# Patient Record
Sex: Female | Born: 1975 | Race: Black or African American | Hispanic: No | Smoking: Never smoker
Health system: Southern US, Community
[De-identification: ages and names within clinical notes are randomized; demographics above are authoritative.]

## PROBLEM LIST (undated history)

## (undated) DIAGNOSIS — I10 Essential (primary) hypertension: Secondary | ICD-10-CM

## (undated) DIAGNOSIS — G43909 Migraine, unspecified, not intractable, without status migrainosus: Secondary | ICD-10-CM

## (undated) HISTORY — PX: TUBAL LIGATION: SHX77

## (undated) HISTORY — DX: Essential (primary) hypertension: I10

---

## 2003-05-08 ENCOUNTER — Inpatient Hospital Stay (HOSPITAL_COMMUNITY): Admission: AD | Admit: 2003-05-08 | Discharge: 2003-05-11 | Payer: Self-pay | Admitting: Obstetrics and Gynecology

## 2005-12-29 ENCOUNTER — Emergency Department (HOSPITAL_COMMUNITY): Admission: EM | Admit: 2005-12-29 | Discharge: 2005-12-29 | Payer: Self-pay | Admitting: Emergency Medicine

## 2006-06-22 ENCOUNTER — Ambulatory Visit (HOSPITAL_COMMUNITY): Admission: AD | Admit: 2006-06-22 | Discharge: 2006-06-22 | Payer: Self-pay | Admitting: Obstetrics and Gynecology

## 2006-06-25 ENCOUNTER — Ambulatory Visit (HOSPITAL_COMMUNITY): Admission: AD | Admit: 2006-06-25 | Discharge: 2006-06-25 | Payer: Self-pay | Admitting: Obstetrics and Gynecology

## 2006-06-29 ENCOUNTER — Ambulatory Visit (HOSPITAL_COMMUNITY): Admission: AD | Admit: 2006-06-29 | Discharge: 2006-06-29 | Payer: Self-pay | Admitting: Obstetrics and Gynecology

## 2006-07-02 ENCOUNTER — Ambulatory Visit (HOSPITAL_COMMUNITY): Admission: AD | Admit: 2006-07-02 | Discharge: 2006-07-02 | Payer: Self-pay | Admitting: Obstetrics and Gynecology

## 2006-07-06 ENCOUNTER — Ambulatory Visit (HOSPITAL_COMMUNITY): Admission: AD | Admit: 2006-07-06 | Discharge: 2006-07-06 | Payer: Self-pay | Admitting: Obstetrics and Gynecology

## 2006-07-09 ENCOUNTER — Ambulatory Visit (HOSPITAL_COMMUNITY): Admission: AD | Admit: 2006-07-09 | Discharge: 2006-07-09 | Payer: Self-pay | Admitting: Obstetrics and Gynecology

## 2006-07-13 ENCOUNTER — Ambulatory Visit (HOSPITAL_COMMUNITY): Admission: AD | Admit: 2006-07-13 | Discharge: 2006-07-13 | Payer: Self-pay | Admitting: Obstetrics and Gynecology

## 2006-07-16 ENCOUNTER — Ambulatory Visit (HOSPITAL_COMMUNITY): Admission: AD | Admit: 2006-07-16 | Discharge: 2006-07-16 | Payer: Self-pay | Admitting: Obstetrics and Gynecology

## 2006-07-20 ENCOUNTER — Ambulatory Visit (HOSPITAL_COMMUNITY): Admission: AD | Admit: 2006-07-20 | Discharge: 2006-07-20 | Payer: Self-pay | Admitting: Obstetrics and Gynecology

## 2006-07-23 ENCOUNTER — Ambulatory Visit (HOSPITAL_COMMUNITY): Admission: AD | Admit: 2006-07-23 | Discharge: 2006-07-23 | Payer: Self-pay | Admitting: Obstetrics and Gynecology

## 2006-07-27 ENCOUNTER — Ambulatory Visit (HOSPITAL_COMMUNITY): Admission: AD | Admit: 2006-07-27 | Discharge: 2006-07-27 | Payer: Self-pay | Admitting: Obstetrics and Gynecology

## 2006-07-29 ENCOUNTER — Inpatient Hospital Stay (HOSPITAL_COMMUNITY): Admission: AD | Admit: 2006-07-29 | Discharge: 2006-08-01 | Payer: Self-pay | Admitting: Obstetrics and Gynecology

## 2006-07-29 ENCOUNTER — Encounter (INDEPENDENT_AMBULATORY_CARE_PROVIDER_SITE_OTHER): Payer: Self-pay | Admitting: Specialist

## 2008-02-12 ENCOUNTER — Inpatient Hospital Stay (HOSPITAL_COMMUNITY): Admission: RE | Admit: 2008-02-12 | Discharge: 2008-02-15 | Payer: Self-pay | Admitting: Obstetrics & Gynecology

## 2008-02-12 ENCOUNTER — Encounter: Payer: Self-pay | Admitting: Obstetrics & Gynecology

## 2010-09-24 NOTE — Op Note (Signed)
April Finley, April Finley                 ACCOUNT NO.:  [id number]   MEDICAL RECORD NO.:  [id number]          PATIENT TYPE:  INP   LOCATION:  9115                          FACILITY:  [facility]   PHYSICIAN:  Lazaro Arms, M.D.   DATE OF BIRTH:  Jul 31, 1975   DATE OF PROCEDURE:  02/12/2008  DATE OF DISCHARGE:                               OPERATIVE REPORT   PREOPERATIVE DIAGNOSES:  1. Intrauterine pregnancy at 7 weeks' gestation.  2. Previous cesarean section x2.  3. Chronic hypertension.  4. Desires sterilization.   POSTOPERATIVE DIAGNOSES:  1. Intrauterine pregnancy at 70 weeks' gestation.  2. Previous cesarean section x2.  3. Chronic hypertension.  4. Desires sterilization.  5. Back-up transverse lie.   PROCEDURES:  1. Repeat cesarean section.  2. Tubal ligation.   SURGEON:  Lazaro Arms, MD   ANESTHESIA:  Spinal.   FINDINGS:  Low-transverse hysterotomy incision was made, entered the  uterus, and found that the baby was a back-up transverse lie, really  could not get the head down.  So, I did a breach extraction because of  her previous C-sections x3, she really had no abdominal wall give at all  and so, I had to cut the muscles and actually extend the incision a  little bit in order to deliver the head.  There was a nuchal cord x1,  three-vessel cord.  Cord blood and cord gasses were sent.  Dr. Franchot Mimes was  in attendance for routine neonatal resuscitation.   DESCRIPTION OF OPERATION:  The patient was taken to the operating room  and placed in sitting position, underwent a spinal anesthetic.  She was  then placed in supine position.  She was prepped and draped in the usual  sterile fashion.  A Foley catheter was placed.  Pfannenstiel skin  incision was made carrying down sharp through rectus fascia, scored in  the midline extending laterally.  The fascia was taken off the muscles  superiorly and inferiorly without difficulty.  Muscles were divided.  Peritoneal cavity was entered.   There was a very low anterior abdominal  wall mobility.  Really, no bladder flap could be developed.  So, I made  an incision in the uterus transversely and entered the uterine cavity.  There was amniotic clear fluid.  The baby was in a back-up transverse  lie.  Really, could not get the head down, so did a breach extraction.  Had to cut the anterior abdominal wall muscles and extend the incision  in order to deliver the head. There was a loose nuchal  x1.  Apgars were  6 and 9, it was a female.  Cord gas was sent to have pH at the time of  dictation.  The placenta was delivered spontaneously.  The uterus was  closed in 2 layers, the first being a running interlocking layer, the  second being an imbricating layer.  I then did the modified Pomeroy  tubal ligation.  There was bleeding on the right tube, so I had to  reinforce sutures and sewed up the mesosalpinx on the right.  I had  to  put some interrupted sutures in the uterus for hemostasis.  The pelvis  was irrigated vigorously.  There was good hemostasis of all pedicles at  this point.  Muscles had to be reapproximated with chromic, because I  had to transect them in order to get the baby's head out.  The fascia  was closed using 0 Vicryl running.  The skin was closed using skin  staples.  The patient tolerated the procedure well.  She experienced  1000 mL of blood loss and taken to the recovery in good and stable  condition.  All counts correct x3.      Lazaro Arms, M.D.  Electronically Signed     LHE/MEDQ  D:  02/12/2008  T:  02/12/2008  Job:  [id number]

## 2010-09-24 NOTE — Discharge Summary (Signed)
April Finley, April Finley                 ACCOUNT NO.:  [id number]   MEDICAL RECORD NO.:  [id number]          PATIENT TYPE:  INP   LOCATION:  9115                          FACILITY:  [facility]   PHYSICIAN:  Lesly Dukes, M.D. DATE OF BIRTH:  Sep 11, 1975   DATE OF ADMISSION:  02/12/2008  DATE OF DISCHARGE:  02/15/2008                               DISCHARGE SUMMARY   DIAGNOSIS AT ADMISSION:  Cesarean section.   DIAGNOSIS AT DISCHARGE:  Status post cesarean section for transverse  lie, back up with delivery of a 39-week female.   PROCEDURES DONE:  On stress test, low transverse cesarean section, and  bilateral tubal ligation.   BRIEF HISTORY AND HOSPITAL COURSE:  This is a 35 year old G4, P4 at 51  weeks who presented for cesarean section and sterilization.  There is a  3-vessel cord.  Estimated blood loss was 1000 mL.  Apgars were 6 at one  minute and 9 at five minutes.  Mother is using bilateral tubal ligation  as contraception.  Group B strep was positive, rubella immune, blood  type A positive, hepatitis B surface antigen negative, and HIV negative.  The patient followed a steady course of improvement in the hospital and  was eating, ambulatory, stooling and passing gas at the time of  discharge.  Pain is controlled with Percocet.   MEDICATIONS AT DISCHARGE:  1. Prenatal vitamins 1 tablet p.o. daily.  2. Iron sulfate 0.5 mL p.o. b.i.d.  3. Aldomet p.o. b.i.d.  4. Percocet 5/325 mg 1-2 tablets p.o. q.6 h. p.r.n. pain.  5. Motrin 800 mg p.o. q.8. h. p.r.n. pain.   ACTIVITY:  Pelvic rest for 6 weeks.  No lifting of greater than 10  pounds for 6 weeks.   DIET:  Routine.   STATUS:  Well.   DISCHARGE CONDITION:  Stable.   DISCHARGE DISPOSITION:  Will be to home.   FOLLOWUP:  Followup will be Friday, February 18, 2008, with Dr. Despina Hidden for  staple removal.  The patient can also have her baby circumcised at that  time.  The patient should also pain to follow up with Dr. Despina Hidden in 4  weeks.   Hemoglobin at discharge is 8.4.      Rodney Langton, MD      Lesly Dukes, M.D.  Electronically Signed    TT/MEDQ  D:  02/15/2008  T:  02/15/2008  Job:  [id number]

## 2010-09-27 NOTE — Discharge Summary (Signed)
[name], April Finley                ACCOUNT NO.:  [id number]   MEDICAL RECORD NO.:  [id number]          PATIENT TYPE:  INP   LOCATION:  A401                          FACILITY:  [facility]   PHYSICIAN:  Lazaro Arms, M.D.   DATE OF BIRTH:  [birth year]   DATE OF ADMISSION:  07/29/2006  DATE OF DISCHARGE:  03/22/2008LH                               DISCHARGE SUMMARY   DISCHARGE DIAGNOSIS:  1. Status post repeat cesarean section.  2. Unremarkable postoperative course.   Please refer to History and Physical on antepartum chart for details of  admission to the hospital.   HOSPITAL COURSE:  The patient is admitted in labor. She was previous C-  section x2. We had had her planned for a C-section on the July 31, 2006  anyway, but she came in in active labor. As a result we proceeded with a  C-section under spinal. She did quite well. Intraoperatively please see  the Op note for details. Postop she did great. She tolerated clear  liquids and a regular diet. She voided without symptoms. She was  extensively ambulatory. Her incision was clean, dry, and intact  throughout. Her hemoglobin and hematocrit were stable 10.6 and 34.2 with  a normal white count postoperatively. She was discharged to home in the  morning on postop day #3 in good stable condition to follow up in the  office on the 31st as scheduled to have her incision evaluated. She did  not have staples. She was discharged to home on Percocet 5/325 #30 and  Motrin 800 mg #20. She is given instructions and precautions for return  prior to that time.      Lazaro Arms, M.D.  Electronically Signed     LHE/MEDQ  D:  08/01/2006  T:  08/01/2006  Job:  [id number]

## 2010-09-27 NOTE — Op Note (Signed)
April Finley, April Finley                ACCOUNT NO.:  [id number]   MEDICAL RECORD NO.:  [id number]          PATIENT TYPE:  INP   LOCATION:  A415                          FACILITY:  [facility]   PHYSICIAN:  Lazaro Arms, M.D.   DATE OF BIRTH:  [birth year]   DATE OF PROCEDURE:  07/29/2006  DATE OF DISCHARGE:                               OPERATIVE REPORT   PREOPERATIVE DIAGNOSES:  1. Intrauterine pregnancy at 39-6/[redacted] weeks gestation.  2. Previous cesarean section x2.  3. Labor.  4. Chronic hypertension.   POSTOPERATIVE DIAGNOSES:  1. Intrauterine pregnancy at 39-6/[redacted] weeks gestation.  2. Previous cesarean section x2.  3. Labor.  4. Chronic hypertension.   PROCEDURE:  Repeat cesarean section.   SURGEON:  Lazaro Arms, M.D.   ANESTHESIA:  Spinal.   FINDINGS:  Over a low transverse hysterotomy incision was delivered a  viable female infant with Apgars of 9 and 9 with weight to be determined  in the nursery at 2100.  There is three-vessel cord.  Cord blood and  cord gas were sent.  Placenta was normal and intact.  The uterus, tubes  and ovaries were all normal.   DESCRIPTION OF OPERATION:  The patient was taken to the operating room,  placed in sitting position where she underwent spinal anesthetic.  She  was then placed in the supine position with a roll under her right hip.  She was prepped and draped in the usual sterile fashion and Pfannenstiel  skin incision was made and carried down sharply to the rectus fascia  which was scored in midline, extended laterally.  The fascia was taken  off the muscles superiorly and inferiorly without difficulty.  The  muscles were divided.  Peritoneal cavity was entered.  A large  protractor was placed to hold the muscle and skin back.  Bladder blade  was placed.  Vesicouterine serosal flap was created.  Low transverse  hysterotomy incision was made and over this incision was delivered a  viable female infant at 2100 with Apgars of 9 and 9.  The  infant  underwent routine neonatal resuscitation.  Dr. Milford Cage was present.  Cord  blood and cord gas were sent.  The placenta was delivered spontaneously.  The uterus was wiped clean with clean lap pad after placenta was  delivered.  The uterus was closed in two layers first being a running  interlocking layer.  The second being imbricating layer.  There was good  hemostasis.  The pelvis was irrigated vigorously and all pedicles found  to be hemostatic.  The protractor was removed.  The muscles  reapproximated using 0-0 chromic.  The fascia closed using 0-0 Vicryl  running, subcutaneous tissue was made hemostatic and irrigated and the  subcutaneous tissue was reapproximated with the 2-0 plain.  The skin was  then closed using 3-0 Vicryl on a Keith needle in a subcuticular  fashion.  There was good hemostasis.  Dermabond was then placed for  additional skin edge reapproximation and protection.  The patient was  taken to recovery room in good and stable condition.  All counts  correct  x3.  She received Ancef after cord clamp.      Lazaro Arms, M.D.  Electronically Signed     LHE/MEDQ  D:  07/29/2006  T:  07/30/2006  Job:  [id number]

## 2010-09-27 NOTE — H&P (Signed)
NAME:  RETHER, RISON                          ACCOUNT NO.:  [id number]   MEDICAL RECORD NO.:  [id number]                   PATIENT TYPE:  INP   LOCATION:  LDR4                                 FACILITY:  [facility]   PHYSICIAN:  Tilda Burrow, M.D.              DATE OF BIRTH:  01-14-1976   DATE OF ADMISSION:  05/08/2003  DATE OF DISCHARGE:  05/11/2003                                HISTORY & PHYSICAL   ADMISSION DIAGNOSES:  1. Pregnant.  2. Chronic hypertension.  3. 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  4. Status post cesarean section x2.   HISTORY OF PRESENT ILLNESS:  This 35 year old female gravida 3, para 2, LMP  unknown, presents to [facility] labor and delivery complaining of menstrual-  like cramps with no right upper quadrant pain, headaches, scotoma, nausea,  vomiting, or diarrhea.  Her last menstrual period was in late June.  She had  sought no prenatal care.  The patient had a history of chronic hypertension  on Cardizem, treated by Dr. Lodema Hong through [facility] until the  pregnancy was suspected.  She stopped the Cardizem two months ago after  perceived feet-swelling.  She has, even though she has relatives in the  medical field, she has not sought medical care.   PAST MEDICAL HISTORY:  Chronic hypertension.   PAST SURGICAL HISTORY:  C-sections x2.  Injuries none.   ALLERGIES:  PENICILLIN causes a rash and swelling.   MEDICATIONS:  Cardizem CD until 2-3 months ago.   PHYSICAL EXAMINATION:  VITAL SIGNS:  Height 5 feet 7 inches.  Weight  estimated to 120.  Blood pressure 187/122.  GENERAL:  She is a large-framed African-American female who is alert and  oriented x 3.  NECK:  Supple.  CHEST:  Without rales or rhonchi.  ABDOMEN:  Uterus is 4 cm above the umbilicus.  NEUROLOGIC:  Reflexes 1+.   Ultrasound is performed which shows a biparietal diameter of 48 mm,  consistent with 20 weeks, 5 days with a collapsed head shape  (dolichocephalic) as well  as severe oligohydramnios with amniotic fluid  index less than 1.  Fetal demise with postmortem changes are identified.   HOSPITAL COURSE:  Patient was admitted, and laboratory evaluation obtained  to rule out severe preeclampsia or HELLP syndrome.  Prenatal I profile was  also obtained.  Laboratory data was notable for hemoglobin 13, hematocrit  41.4, white count 7800 with a normal differential.  There was slightly  increased band count noted on December 28.  She had hepatitis and HIV  negative.  She had liver function tests which showed a normal AST and ALT of  18 and 12, respectively.  BUN was 2.  Creatinine 0.6.   The patient was treated with antihypertensive.  Initially, we thought we  were perhaps dealing with a severe chronic hypertension.  Interestingly, she  developed the urge to push within the first  hour, even before any bleeding  had been noted.  She then proceeded to expel an intact and significantly  autolysed fetus with attached placenta and intact amniotic sac with minimal  fluid within it.   Urinalysis returned 3+ proteinuria (greater than 300 mg/dl).  The patient  did not respond very well to the hypertensives.  On the morning of May 09, 2003, approximately four hours postpartum, it became obvious that she  was not responding to reasonable dose of labetalol and Apresoline, so  magnesium sulfate was added.  She received magnesium sulfate for 24 hours  after initiation.   Pathology report returned a 500 gm fetus 34 cm in length with the placental  attached, a 24 cm autolysed cord that is not twisted.  The placenta is  sectioned, pale in color.  The weight is 130 gm, very small for the  gestation.  This is consistent with diagnosis of chronic hypertension,  uncontrolled.  The fetus was macerated and felt to represent a female fetus.  The autolysis and dysmorphism suggested the possibility of Trisomy, although  genetic testing was not performed to confirm this.    Patient was kept for an additional 48 hours postpartum.  Magnesium sulfate  was discontinued on May 10, 2003.  Patient kept an additional 24 hours  and discharged at that time for followup in three days in our office for  blood pressure recheck.     ___________________________________________                                         Tilda Burrow, M.D.   JVF/MEDQ  D:  05/22/2003  T:  05/22/2003  Job:  [id number]

## 2011-02-11 LAB — CBC
HCT: 27 — ABNORMAL LOW
HCT: 27.4 — ABNORMAL LOW
HCT: 37.3
Hemoglobin: 11.2 — ABNORMAL LOW
Hemoglobin: 8.4 — ABNORMAL LOW
Hemoglobin: 8.5 — ABNORMAL LOW
MCHC: 31.5
MCV: 65.3 — ABNORMAL LOW
MCV: 66.9 — ABNORMAL LOW
Platelets: 218
RBC: 5.63 — ABNORMAL HIGH
RDW: 24.9 — ABNORMAL HIGH
RDW: 24.9 — ABNORMAL HIGH
RDW: 25.8 — ABNORMAL HIGH
WBC: 6.7

## 2011-02-11 LAB — BASIC METABOLIC PANEL
CO2: 22
Chloride: 105
Glucose, Bld: 70
Potassium: 3.7
Sodium: 135

## 2011-08-20 ENCOUNTER — Other Ambulatory Visit: Payer: Self-pay | Admitting: Adult Health

## 2011-08-20 ENCOUNTER — Other Ambulatory Visit (HOSPITAL_COMMUNITY)
Admission: RE | Admit: 2011-08-20 | Discharge: 2011-08-20 | Disposition: A | Discharge status: Home / Self Care | Payer: BC Managed Care – PPO | Source: Ambulatory Visit | Attending: Obstetrics and Gynecology | Admitting: Obstetrics and Gynecology

## 2011-08-20 DIAGNOSIS — Z01419 Encounter for gynecological examination (general) (routine) without abnormal findings: Secondary | ICD-10-CM | POA: Insufficient documentation

## 2011-09-15 ENCOUNTER — Other Ambulatory Visit: Payer: Self-pay | Admitting: Adult Health

## 2011-09-15 DIAGNOSIS — I10 Essential (primary) hypertension: Secondary | ICD-10-CM

## 2011-09-15 DIAGNOSIS — R809 Proteinuria, unspecified: Secondary | ICD-10-CM

## 2011-09-18 ENCOUNTER — Ambulatory Visit (HOSPITAL_COMMUNITY)
Admission: RE | Admit: 2011-09-18 | Discharge: 2011-09-18 | Disposition: A | Discharge status: Home / Self Care | Payer: BC Managed Care – PPO | Source: Ambulatory Visit | Attending: Adult Health | Admitting: Adult Health

## 2011-09-18 DIAGNOSIS — R809 Proteinuria, unspecified: Secondary | ICD-10-CM | POA: Insufficient documentation

## 2011-09-18 DIAGNOSIS — I10 Essential (primary) hypertension: Secondary | ICD-10-CM | POA: Insufficient documentation

## 2011-11-12 ENCOUNTER — Ambulatory Visit (HOSPITAL_COMMUNITY)
Admission: RE | Admit: 2011-11-12 | Discharge: 2011-11-12 | Disposition: A | Discharge status: Home / Self Care | Payer: BC Managed Care – PPO | Source: Ambulatory Visit | Attending: Nephrology | Admitting: Nephrology

## 2011-11-12 ENCOUNTER — Other Ambulatory Visit (HOSPITAL_COMMUNITY): Payer: Self-pay | Admitting: Nephrology

## 2011-11-12 DIAGNOSIS — I10 Essential (primary) hypertension: Secondary | ICD-10-CM

## 2011-11-12 DIAGNOSIS — I059 Rheumatic mitral valve disease, unspecified: Secondary | ICD-10-CM | POA: Insufficient documentation

## 2011-11-12 NOTE — Progress Notes (Signed)
*  PRELIMINARY RESULTS* Echocardiogram 2D Echocardiogram has been performed.  April Finley 11/12/2011, 11:33 AM

## 2013-02-23 ENCOUNTER — Ambulatory Visit (INDEPENDENT_AMBULATORY_CARE_PROVIDER_SITE_OTHER): Payer: BC Managed Care – PPO | Admitting: Family Medicine

## 2013-02-23 ENCOUNTER — Encounter: Payer: Self-pay | Admitting: Family Medicine

## 2013-02-23 VITALS — BP 154/104 | Temp 98.3°F | Ht 65.0 in | Wt 185.0 lb

## 2013-02-23 DIAGNOSIS — K122 Cellulitis and abscess of mouth: Secondary | ICD-10-CM

## 2013-02-23 MED ORDER — CLINDAMYCIN HCL 300 MG PO CAPS: 300.0000 mg | ORAL_CAPSULE | Freq: Three times a day (TID) | ORAL | Status: DC

## 2013-02-23 MED ORDER — HYDROCODONE-ACETAMINOPHEN 5-325 MG PO TABS: 1.0000 | ORAL_TABLET | ORAL | Status: DC | PRN

## 2013-02-23 MED ORDER — SULFAMETHOXAZOLE-TMP DS 800-160 MG PO TABS: 1.0000 | ORAL_TABLET | Freq: Two times a day (BID) | ORAL | Status: DC

## 2013-02-23 NOTE — Progress Notes (Signed)
  Subjective:    Patient ID: April Finley, female    DOB: 20-Mar-1976, 37 y.o.   MRN: [id number]  HPIAbscess tooth for 1 week. Painful last two days. Taking ibuprofen OTC.  See above. No prior history.   Review of Systems     Objective:   Physical Exam Eardrums normal moderate tooth infection with abscess noted on the right side throat is normal neck supple lungs clear       Assessment & Plan:  Impacted tooth with infection-clindamycin 3 times a day 10 days if she cannot afford that then used Bactrim twice a day 10 days Vicodin for severe pain cautioned drowsiness very important for her to see a dentist I spoke with her at length about this she stated she will get it done

## 2013-02-23 NOTE — Patient Instructions (Signed)
Clindamycin- one 3 times a day for 10  Call dentist soon  Warm compresses 20 minutes several times a day

## 2013-05-29 ENCOUNTER — Other Ambulatory Visit: Payer: Self-pay | Admitting: Adult Health

## 2013-05-30 ENCOUNTER — Encounter: Payer: Self-pay | Admitting: *Deleted

## 2013-05-30 ENCOUNTER — Telehealth: Payer: Self-pay | Admitting: Family Medicine

## 2013-05-30 NOTE — Telephone Encounter (Signed)
error 

## 2013-06-01 ENCOUNTER — Ambulatory Visit (INDEPENDENT_AMBULATORY_CARE_PROVIDER_SITE_OTHER): Payer: BC Managed Care – PPO | Admitting: Family Medicine

## 2013-06-01 ENCOUNTER — Encounter: Payer: Self-pay | Admitting: Family Medicine

## 2013-06-01 VITALS — BP 160/100 | Ht 65.0 in | Wt 175.4 lb

## 2013-06-01 DIAGNOSIS — Z79899 Other long term (current) drug therapy: Secondary | ICD-10-CM

## 2013-06-01 DIAGNOSIS — R809 Proteinuria, unspecified: Secondary | ICD-10-CM

## 2013-06-01 DIAGNOSIS — I10 Essential (primary) hypertension: Secondary | ICD-10-CM

## 2013-06-01 DIAGNOSIS — Z0189 Encounter for other specified special examinations: Secondary | ICD-10-CM

## 2013-06-01 MED ORDER — TRIAMTERENE-HCTZ 37.5-25 MG PO TABS: 1.0000 | ORAL_TABLET | Freq: Every day | ORAL | Status: DC

## 2013-06-01 MED ORDER — AMLODIPINE BESYLATE 5 MG PO TABS
5.0000 mg | ORAL_TABLET | Freq: Every day | ORAL | Status: DC
Start: 2013-06-01 — End: 2013-07-20

## 2013-06-01 NOTE — Progress Notes (Signed)
   Subjective:    Patient ID: April Finley, female    DOB: 10/12/1975, 38 y.o.   MRN: [id number]  Hypertension This is a chronic problem. The current episode started more than 1 month ago. The problem has been gradually worsening since onset. The problem is uncontrolled. Pertinent negatives include no chest pain or shortness of breath. There are no associated agents to hypertension. There are no known risk factors for coronary artery disease. Treatments tried: maxzide. The current treatment provides no improvement. There are no compliance problems.    PMH benign/HTN family history HTN dialysis   Review of Systems  Constitutional: Negative for activity change, appetite change and fatigue.  Respiratory: Negative for cough, chest tightness and shortness of breath.   Cardiovascular: Negative for chest pain.  Endocrine: Negative for polydipsia and polyphagia.  Genitourinary: Negative for frequency.  Neurological: Negative for weakness.  Psychiatric/Behavioral: Negative for confusion.       Objective:   Physical Exam  Vitals reviewed. Constitutional: She appears well-nourished. No distress.  Cardiovascular: Normal rate, regular rhythm and normal heart sounds.   No murmur heard. Pulmonary/Chest: Effort normal and breath sounds normal. No respiratory distress.  Musculoskeletal: She exhibits no edema.  Lymphadenopathy:    She has no cervical adenopathy.  Neurological: She is alert. She exhibits normal muscle tone.  Psychiatric: Her behavior is normal.    EKG was completed no sign of LVH      Assessment & Plan:  #1 HTN-poor control add Norvasc 5 mg daily. Hopefully this will help may need to go to 10 mg followup in 4-6 weeks check blood pressure readings at home. Bring monitor cuff with her. Proper diet discussed information printed regular exercise advised as well  #2 there is a family history of dialysis plus patient states that she had significant protein in her urine we will do  some tests may need to do additional testing or possibly referred to nephrology depending on the results

## 2013-06-01 NOTE — Patient Instructions (Signed)
DASH Diet  The DASH diet stands for "Dietary Approaches to Stop Hypertension." It is a healthy eating plan that has been shown to reduce high blood pressure (hypertension) in as little as 14 days, while also possibly providing other significant health benefits. These other health benefits include reducing the risk of breast cancer after menopause and reducing the risk of type 2 diabetes, heart disease, colon cancer, and stroke. Health benefits also include weight loss and slowing kidney failure in patients with chronic kidney disease.   DIET GUIDELINES  · Limit salt (sodium). Your diet should contain less than 1500 mg of sodium daily.  · Limit refined or processed carbohydrates. Your diet should include mostly whole grains. Desserts and added sugars should be used sparingly.  · Include small amounts of heart-healthy fats. These types of fats include nuts, oils, and tub margarine. Limit saturated and trans fats. These fats have been shown to be harmful in the body.  CHOOSING FOODS   The following food groups are based on a 2000 calorie diet. See your Registered Dietitian for individual calorie needs.  Grains and Grain Products (6 to 8 servings daily)  · Eat More Often: Whole-wheat bread, brown rice, whole-grain or wheat pasta, quinoa, popcorn without added fat or salt (air popped).  · Eat Less Often: White bread, white pasta, white rice, cornbread.  Vegetables (4 to 5 servings daily)  · Eat More Often: Fresh, frozen, and canned vegetables. Vegetables may be raw, steamed, roasted, or grilled with a minimal amount of fat.  · Eat Less Often/Avoid: Creamed or fried vegetables. Vegetables in a cheese sauce.  Fruit (4 to 5 servings daily)  · Eat More Often: All fresh, canned (in natural juice), or frozen fruits. Dried fruits without added sugar. One hundred percent fruit juice (½ cup [237 mL] daily).  · Eat Less Often: Dried fruits with added sugar. Canned fruit in light or heavy syrup.  Lean Meats, Fish, and Poultry (2  servings or less daily. One serving is 3 to 4 oz [85-114 g]).  · Eat More Often: Ninety percent or leaner ground beef, tenderloin, sirloin. Round cuts of beef, chicken breast, turkey breast. All fish. Grill, bake, or broil your meat. Nothing should be fried.  · Eat Less Often/Avoid: Fatty cuts of meat, turkey, or chicken leg, thigh, or wing. Fried cuts of meat or fish.  Dairy (2 to 3 servings)  · Eat More Often: Low-fat or fat-free milk, low-fat plain or light yogurt, reduced-fat or part-skim cheese.  · Eat Less Often/Avoid: Milk (whole, 2%). Whole milk yogurt. Full-fat cheeses.  Nuts, Seeds, and Legumes (4 to 5 servings per week)  · Eat More Often: All without added salt.  · Eat Less Often/Avoid: Salted nuts and seeds, canned beans with added salt.  Fats and Sweets (limited)  · Eat More Often: Vegetable oils, tub margarines without trans fats, sugar-free gelatin. Mayonnaise and salad dressings.  · Eat Less Often/Avoid: Coconut oils, palm oils, butter, stick margarine, cream, half and half, cookies, candy, pie.  FOR MORE INFORMATION  The Dash Diet Eating Plan: www.dashdiet.org  Document Released: 04/17/2011 Document Revised: 07/21/2011 Document Reviewed: 04/17/2011  Exit[employer]® Patient Information ©2014 [employer].

## 2013-06-02 LAB — BASIC METABOLIC PANEL
BUN: 10 mg/dL (ref 6–23)
CALCIUM: 9 mg/dL (ref 8.4–10.5)
CHLORIDE: 102 meq/L (ref 96–112)
CO2: 27 mEq/L (ref 19–32)
CREATININE: 0.82 mg/dL (ref 0.50–1.10)
Glucose, Bld: 92 mg/dL (ref 70–99)
Potassium: 4 mEq/L (ref 3.5–5.3)
Sodium: 139 mEq/L (ref 135–145)

## 2013-06-02 LAB — LIPID PANEL
CHOLESTEROL: 157 mg/dL (ref 0–200)
HDL: 53 mg/dL (ref >39)
LDL Cholesterol: 90 mg/dL (ref 0–99)
TRIGLYCERIDES: 69 mg/dL (ref <150)
Total CHOL/HDL Ratio: 3 Ratio
VLDL: 14 mg/dL (ref 0–40)

## 2013-06-03 LAB — MICROALBUMIN, URINE: Microalb, Ur: 90.91 mg/dL — ABNORMAL HIGH (ref 0.00–1.89)

## 2013-06-03 NOTE — Progress Notes (Signed)
Orders are in and spoke with patient and she verbalized understanding of test results.

## 2013-06-03 NOTE — Addendum Note (Signed)
Addended byOneal Deputy: Tejal Monroy D on: 06/03/2013 03:06 PM   Modules accepted: Orders

## 2013-06-20 ENCOUNTER — Encounter: Payer: Self-pay | Admitting: Family Medicine

## 2013-07-06 ENCOUNTER — Encounter: Payer: Self-pay | Admitting: Nurse Practitioner

## 2013-07-06 ENCOUNTER — Ambulatory Visit (INDEPENDENT_AMBULATORY_CARE_PROVIDER_SITE_OTHER): Payer: BC Managed Care – PPO | Admitting: Nurse Practitioner

## 2013-07-06 VITALS — BP 112/80 | Temp 98.9°F | Ht 65.0 in | Wt 175.5 lb

## 2013-07-06 DIAGNOSIS — R51 Headache: Secondary | ICD-10-CM

## 2013-07-06 DIAGNOSIS — J300 Vasomotor rhinitis: Secondary | ICD-10-CM

## 2013-07-06 DIAGNOSIS — J309 Allergic rhinitis, unspecified: Secondary | ICD-10-CM

## 2013-07-06 MED ORDER — ISOMETHEPTENE-APAP-DICHLORAL 65-325-100 MG PO CAPS: ORAL_CAPSULE | ORAL | Status: DC

## 2013-07-06 MED ORDER — FLUTICASONE PROPIONATE 50 MCG/ACT NA SUSP: 2.0000 | Freq: Every day | NASAL | Status: DC

## 2013-07-06 NOTE — Patient Instructions (Signed)
-  OTC antihistamine

## 2013-07-08 ENCOUNTER — Encounter: Payer: Self-pay | Admitting: Nurse Practitioner

## 2013-07-08 NOTE — Progress Notes (Signed)
Subjective:  Presents for complaints of headache that started yesterday. Describes as a "pressure behind the eyes". Lasted several hours. Much improved with rest. Minimal relief with Excedrin Migraine or ibuprofen. No fever. Nausea but no vomiting. No cough runny nose or ear pain. Minimal sore throat. Has a history of migraine type headaches, no recent "bad" headache. Had a normal menstrual cycle last week. Wonders if has any connection to eating some sausage right before this happened. No blurred vision. Some photosensitivity. No phonophobia. Positive family history of migraines. No numbness or weakness of the face arms or legs. BP outside the office runs about 120/80. No syncopal episodes.  Objective:   BP 112/80  Temp(Src) 98.9 F (37.2 C) (Oral)  Ht 5\' 5"  (1.651 m)  Wt 175 lb 8 oz (79.606 kg)  BMI 29.20 kg/m2 NAD. Alert, oriented. TMs significant clear effusion, no erythema. Nasal mucosa pale and moderately boggy. More on the left side. Pharynx nonerythematous with clear PND noted. Neck supple with mild soft anterior adenopathy. Lungs clear. Heart regular rate rhythm. Funduscopic limited exam, pupils equal and reactive to light. EOMs intact without nystagmus. Muscle strength 5+ the upper extremities. Gait normal limit. Reflexes normal limit.  Assessment:Vasomotor rhinitis  Headache(784.0)  To Plan: Meds ordered this encounter  Medications  . fluticasone (FLONASE) 50 MCG/ACT nasal spray    Sig: Place 2 sprays into both nostrils daily.    Dispense:  16 g    Refill:  5    Order Specific Question:  Supervising Provider    Answer:  Merlyn AlbertLUKING, WILLIAM S [[id number]]  . isometheptene-acetaminophen-dichloralphenazone (MIDRIN) 65-325-100 MG capsule    Sig: 2 po at onset of migraine; may repeat q hr until relief; max 5 per 12 hours    Dispense:  30 capsule    Refill:  0    Order Specific Question:  Supervising Provider    Answer:  Merlyn AlbertLUKING, WILLIAM S [[id number]]   OTC antihistamines as directed. Given  prescription for Midrin to have on hand in case of migraine. Warning signs reviewed regarding headaches. Callback in 5-7 days if no improvement in current headache, sooner if worse. Also recheck if she begins having recurrent headaches.

## 2013-07-11 LAB — CREATININE CLEARANCE, URINE, 24 HOUR
CREAT CLEAR: 70 mL/min — AB (ref 75–115)
CREATININE 24H UR: 899 mg/d (ref 700–1800)
Creatinine, Urine: 112.3 mg/dL
Creatinine: 0.89 mg/dL (ref 0.50–1.10)

## 2013-07-11 LAB — PROTEIN, URINE, 24 HOUR
PROTEIN, URINE: 55 mg/dL
Protein, 24H Urine: 440 mg/d — ABNORMAL HIGH (ref 50–100)

## 2013-07-12 NOTE — Progress Notes (Signed)
Patient notified and verbalized understanding. She has a f/u appt tomorrow.

## 2013-07-13 ENCOUNTER — Ambulatory Visit: Payer: BC Managed Care – PPO | Admitting: Family Medicine

## 2013-07-20 ENCOUNTER — Ambulatory Visit (INDEPENDENT_AMBULATORY_CARE_PROVIDER_SITE_OTHER): Payer: BC Managed Care – PPO | Admitting: Family Medicine

## 2013-07-20 ENCOUNTER — Encounter: Payer: Self-pay | Admitting: Family Medicine

## 2013-07-20 VITALS — BP 138/92 | Ht 65.0 in | Wt 180.0 lb

## 2013-07-20 DIAGNOSIS — N189 Chronic kidney disease, unspecified: Secondary | ICD-10-CM

## 2013-07-20 DIAGNOSIS — I10 Essential (primary) hypertension: Secondary | ICD-10-CM

## 2013-07-20 MED ORDER — HYDROCHLOROTHIAZIDE 25 MG PO TABS: 25.0000 mg | ORAL_TABLET | ORAL | Status: DC

## 2013-07-20 MED ORDER — LISINOPRIL 5 MG PO TABS: 5.0000 mg | ORAL_TABLET | ORAL | Status: DC

## 2013-07-20 NOTE — Progress Notes (Signed)
   Subjective:    Patient ID: April Finley, female    DOB: 09/23/1975, 38 y.o.   MRN: [id number]  HPI Patient is here today for a f/u on HTN. Patient states she's trying eat healthy she is trying to exercise some she is trying to watch her salt in her diet  She does have family history hypertension also her mother had been on dialysis for several years before having a renal transplant She denies any chest tightness pressure pain shortness breath nausea vomiting diarrhea.  She forgot to bring her BP cuff and her BP readings. She states her blood pressure readings are in the 120s to 1:30 over 80s.  No concerns.   Denies any HTN symptoms.     Review of Systems See above. Denies swelling. Denies hematuria or rectal bleeding.    Objective:   Physical Exam Lungs are clear hearts regular pulse normal blood pressure good on recheck. One 32/76 extremities no edema skin warm dry  Detailed discussion held regarding proteinuria 24-hour urine protein 24-hour urine creatinine and her lab work.       Assessment & Plan:  Hypertension Proteinuria Chronic renal disease Medications adjusted lisinopril added triamterene stopped, HCTZ started as a independent agent, we will go ahead and refer to nephrology. 30 minute discussion held with patient regarding proteinuria hypertension the importance of diagnosing the issues in figuring out why she is having this problem. Await the results of her lab work. Multiple questions answered greater than half of the time spent in discussion.

## 2013-07-21 ENCOUNTER — Telehealth: Payer: Self-pay | Admitting: Family Medicine

## 2013-07-21 MED ORDER — ISOMETHEPTENE-APAP-DICHLORAL 65-325-100 MG PO CAPS: ORAL_CAPSULE | ORAL | Status: DC

## 2013-07-21 NOTE — Telephone Encounter (Signed)
Script will be faxed to pharmacy. Patient was notified.  

## 2013-07-21 NOTE — Telephone Encounter (Signed)
Last seen 07/20/13

## 2013-07-21 NOTE — Telephone Encounter (Signed)
Patient says she has run out of her Midrin and would like a refill  [facility]

## 2013-07-21 NOTE — Telephone Encounter (Signed)
May refill x3

## 2013-11-26 LAB — URINALYSIS, ROUTINE W REFLEX MICROSCOPIC
BILIRUBIN URINE: NEGATIVE
Glucose, UA: NEGATIVE mg/dL
Hgb urine dipstick: NEGATIVE
Ketones, ur: NEGATIVE mg/dL
Leukocytes, UA: NEGATIVE
NITRITE: NEGATIVE
PH: 5.5 (ref 5.0–8.0)
Protein, ur: 100 mg/dL — AB
SPECIFIC GRAVITY, URINE: 1.016 (ref 1.005–1.030)
UROBILINOGEN UA: 0.2 mg/dL (ref 0.0–1.0)

## 2013-11-26 LAB — URINALYSIS, MICROSCOPIC ONLY
BACTERIA UA: NONE SEEN
Casts: NONE SEEN
Crystals: NONE SEEN
Squamous Epithelial / LPF: NONE SEEN

## 2013-11-26 LAB — SEDIMENTATION RATE: Sed Rate: 4 mm/hr (ref 0–22)

## 2013-11-28 LAB — ANA: Anti Nuclear Antibody(ANA): NEGATIVE

## 2013-11-29 LAB — UIFE/LIGHT CHAINS/TP QN, 24-HR UR
Albumin, U: DETECTED
Alpha 1, Urine: DETECTED — AB
Alpha 2, Urine: DETECTED — AB
Beta, Urine: DETECTED — AB
FREE KAPPA/LAMBDA RATIO: 10.06 ratio (ref 2.04–10.37)
FREE LAMBDA LT CHAINS, UR: 0.34 mg/dL (ref 0.02–0.67)
Free Kappa Lt Chains,Ur: 3.42 mg/dL — ABNORMAL HIGH (ref 0.14–2.42)
Gamma Globulin, Urine: DETECTED — AB
TOTAL PROTEIN, URINE-UPE24: 58.2 mg/dL

## 2013-11-30 NOTE — Progress Notes (Signed)
Patient notified and verbalized understanding of the test results. No further questions. 

## 2014-01-06 ENCOUNTER — Encounter: Payer: Self-pay | Admitting: Family Medicine

## 2014-01-06 ENCOUNTER — Ambulatory Visit (INDEPENDENT_AMBULATORY_CARE_PROVIDER_SITE_OTHER): Payer: BC Managed Care – PPO | Admitting: Family Medicine

## 2014-01-06 ENCOUNTER — Telehealth: Payer: Self-pay | Admitting: Family Medicine

## 2014-01-06 VITALS — BP 134/90 | Ht 65.0 in | Wt 179.0 lb

## 2014-01-06 DIAGNOSIS — J069 Acute upper respiratory infection, unspecified: Secondary | ICD-10-CM

## 2014-01-06 DIAGNOSIS — I10 Essential (primary) hypertension: Secondary | ICD-10-CM

## 2014-01-06 DIAGNOSIS — F39 Unspecified mood [affective] disorder: Secondary | ICD-10-CM

## 2014-01-06 DIAGNOSIS — R809 Proteinuria, unspecified: Secondary | ICD-10-CM

## 2014-01-06 DIAGNOSIS — R4586 Emotional lability: Secondary | ICD-10-CM

## 2014-01-06 MED ORDER — AMLODIPINE BESYLATE 5 MG PO TABS: 5.0000 mg | ORAL_TABLET | Freq: Every day | ORAL | Status: DC

## 2014-01-06 NOTE — Telephone Encounter (Signed)
Should I cancel the referral to [facility] in [place]?  I already sent them all of her information and appointment is pending.  Please advise

## 2014-01-06 NOTE — Telephone Encounter (Signed)
Please cancel, patient says she has called them multiple times and they now tell her she "might" be seen in Oct or Nov. She would like sonmewhere else

## 2014-01-06 NOTE — Progress Notes (Signed)
   Subjective:    Patient ID: April Finley, female    DOB: June 29, 1975, 38 y.o.   MRN: [id number]  HPIDiscuss possible bipolar. Feels happy then down for the past several months. Friends and family have been telling her she has bipolar. Long discussion held she denies being depressed denies anxiety spells she states that she will go from being happy to being angry to being sad very calmly throughout the course of the same day for no particular reason this is been going on for the past several months and then progressively worse  Patient frustrated that the specialists that we set her up with him in [facility] is not getting her an appointment. We will try at 4 different specialists.  Upper rest for illness over the past few days no high fever chills sweats or vomiting  Review of Systems    patient relates some cough sore throat denies fever chills Objective:   Physical Exam  Lungs are clear heart triggers are normal eardrums normal neck supple      Assessment & Plan:  #1 hypertension with proteinuria ON pressures subpar control increase amlodipine. Referral back to nephrology having difficult time getting an appointment in [facility] referral Will try different specialists  #2 mood swings did screening questionnaire for her bipolar it is positive I would recommend seeing psychiatry to be firm about the diagnosis.  #3 viral upper rest revealed that should gradually get better call us if any problems

## 2014-01-12 ENCOUNTER — Other Ambulatory Visit: Payer: Self-pay | Admitting: Family Medicine

## 2014-01-13 ENCOUNTER — Telehealth: Payer: Self-pay | Admitting: Family Medicine

## 2014-01-13 MED ORDER — ISOMETHEPTENE-APAP-DICHLORAL 65-325-100 MG PO CAPS: ORAL_CAPSULE | ORAL | Status: DC

## 2014-01-13 NOTE — Telephone Encounter (Signed)
Patient said that she has had a migraine for days and needs her Midrin called in ASAP.   [facility]

## 2014-01-13 NOTE — Telephone Encounter (Signed)
May ref midrin

## 2014-01-13 NOTE — Telephone Encounter (Signed)
Rx faxed to pharmacy. Patient notified. 

## 2014-01-13 NOTE — Telephone Encounter (Signed)
ok 

## 2014-01-13 NOTE — Telephone Encounter (Signed)
Patient called again to check on this. She would like this ASAP because she doesn't want to take OTC meds.

## 2014-01-25 NOTE — Telephone Encounter (Signed)
Faxed referral to [facility], await appt info

## 2014-02-13 ENCOUNTER — Encounter: Payer: Self-pay | Admitting: Family Medicine

## 2014-04-27 ENCOUNTER — Ambulatory Visit: Payer: BC Managed Care – PPO | Admitting: Family Medicine

## 2014-05-09 ENCOUNTER — Ambulatory Visit: Payer: BC Managed Care – PPO | Admitting: Family Medicine

## 2014-06-13 ENCOUNTER — Telehealth: Payer: Self-pay | Admitting: Family Medicine

## 2014-06-13 NOTE — Telephone Encounter (Signed)
Spoke with pt. She is having a hard time dealing with the loss. Pt does have HTN. I told her to schedule an OV with Dr. Lorin PicketScott b/c she is due for one. Warning signs discussed. Told her to go to ER if she experiences any worsening s/s. Pt verbalized understanding and was transferred up front to make an appt.

## 2014-06-13 NOTE — Telephone Encounter (Signed)
Pt calling to say she lost a close co worker/employee  Her BP is elevated 152/111, 159/116  No headaches, no other symptoms at this point,  But her employer told her she needed to make you aware  Of the situation

## 2014-06-19 ENCOUNTER — Encounter: Payer: Self-pay | Admitting: Family Medicine

## 2014-06-19 ENCOUNTER — Ambulatory Visit (INDEPENDENT_AMBULATORY_CARE_PROVIDER_SITE_OTHER): Payer: BC Managed Care – PPO | Admitting: Family Medicine

## 2014-06-19 VITALS — BP 160/110 | Ht 66.0 in | Wt 172.0 lb

## 2014-06-19 DIAGNOSIS — G4700 Insomnia, unspecified: Secondary | ICD-10-CM

## 2014-06-19 DIAGNOSIS — I10 Essential (primary) hypertension: Secondary | ICD-10-CM

## 2014-06-19 MED ORDER — ALPRAZOLAM 1 MG PO TABS: 1.0000 mg | ORAL_TABLET | Freq: Every evening | ORAL | Status: DC | PRN

## 2014-06-19 MED ORDER — LISINOPRIL 5 MG PO TABS: 5.0000 mg | ORAL_TABLET | ORAL | Status: DC

## 2014-06-19 MED ORDER — ISOMETHEPTENE-APAP-DICHLORAL 65-325-100 MG PO CAPS: ORAL_CAPSULE | ORAL | Status: DC

## 2014-06-19 MED ORDER — AMLODIPINE BESYLATE 10 MG PO TABS: 10.0000 mg | ORAL_TABLET | Freq: Every day | ORAL | Status: DC

## 2014-06-19 MED ORDER — HYDROCHLOROTHIAZIDE 25 MG PO TABS: 25.0000 mg | ORAL_TABLET | ORAL | Status: DC

## 2014-06-19 NOTE — Progress Notes (Signed)
   Subjective:    Patient ID: Clydene LamingAmie A Napoles, female    DOB: 09/09/1975, 39 y.o.   MRN: [id number]  HPI Patient is here today d/t high BP's.  Pt denies any s/s, no HA or blurry vision  She just recently lost a close friend that she worked with.   Having trouble sleeping at night now.   She has strong family history of hypertension. She denies any chest tightness pressure pain shortness breath. She relates taken her medicines for the most part although she states she does need refills.  Review of Systems Patient denies chest tightness pressure pain shortness breath swelling in the legs.    Objective:   Physical Exam Lungs clear heart trigger pulse normal extremities no edema skin warm dry       Assessment & Plan:  HTN subpar control. Very important to get her blood pressure under control. The goal should be in the range of 120/70. Increase amlodipine from 5 mg to 10 mg. Patient follows up with nephrologist later this month. Hopefully they can check her to see if there is any underlying causes for secondary hypertension. I am concerned about proteinuria she is shown in the past.  Insomnia related to recent stress of losing friend. Short prescription of Xanax given for short-term use to help her with sleeping at night caution drowsiness. Patient denies being depressed.  Patient to follow-up within 4-6 months for recheck.

## 2014-10-05 ENCOUNTER — Ambulatory Visit: Payer: BC Managed Care – PPO | Admitting: Family Medicine

## 2014-10-05 ENCOUNTER — Telehealth: Payer: Self-pay | Admitting: Family Medicine

## 2014-10-05 NOTE — Telephone Encounter (Signed)
error 

## 2014-12-18 ENCOUNTER — Ambulatory Visit: Payer: BC Managed Care – PPO | Admitting: Family Medicine

## 2014-12-18 DIAGNOSIS — Z029 Encounter for administrative examinations, unspecified: Secondary | ICD-10-CM

## 2015-01-15 ENCOUNTER — Other Ambulatory Visit: Payer: Self-pay | Admitting: Family Medicine

## 2015-01-16 ENCOUNTER — Telehealth: Payer: Self-pay | Admitting: Family Medicine

## 2015-01-16 ENCOUNTER — Other Ambulatory Visit: Payer: Self-pay | Admitting: Family Medicine

## 2015-01-16 ENCOUNTER — Other Ambulatory Visit: Payer: Self-pay | Admitting: *Deleted

## 2015-01-16 MED ORDER — ISOMETHEPTENE-DICHLORAL-APAP 65-100-325 MG PO CAPS: ORAL_CAPSULE | ORAL | Status: DC

## 2015-01-16 NOTE — Telephone Encounter (Signed)
[facility] Patient notified.

## 2015-01-16 NOTE — Telephone Encounter (Signed)
Refill times one 

## 2015-01-16 NOTE — Telephone Encounter (Signed)
See phone mess

## 2015-01-16 NOTE — Telephone Encounter (Signed)
Pt is needing a refill on her midrin called [facility]

## 2015-01-16 NOTE — Telephone Encounter (Signed)
Last seen 06/2014

## 2015-01-16 NOTE — Telephone Encounter (Signed)
i think i already did theis via phone mes plz cancel out

## 2015-02-22 ENCOUNTER — Other Ambulatory Visit: Payer: Self-pay | Admitting: Family Medicine

## 2015-03-23 ENCOUNTER — Encounter: Payer: Self-pay | Admitting: Family Medicine

## 2015-03-23 ENCOUNTER — Ambulatory Visit (INDEPENDENT_AMBULATORY_CARE_PROVIDER_SITE_OTHER): Payer: BC Managed Care – PPO | Admitting: Family Medicine

## 2015-03-23 VITALS — BP 136/80 | Ht 66.0 in | Wt 165.2 lb

## 2015-03-23 DIAGNOSIS — G4700 Insomnia, unspecified: Secondary | ICD-10-CM | POA: Diagnosis not present

## 2015-03-23 DIAGNOSIS — R634 Abnormal weight loss: Secondary | ICD-10-CM

## 2015-03-23 DIAGNOSIS — I10 Essential (primary) hypertension: Secondary | ICD-10-CM | POA: Diagnosis not present

## 2015-03-23 MED ORDER — FLUTICASONE PROPIONATE 50 MCG/ACT NA SUSP: 2.0000 | Freq: Every day | NASAL | Status: DC

## 2015-03-23 MED ORDER — LISINOPRIL 5 MG PO TABS: 5.0000 mg | ORAL_TABLET | ORAL | Status: DC

## 2015-03-23 MED ORDER — HYDROCHLOROTHIAZIDE 25 MG PO TABS: 25.0000 mg | ORAL_TABLET | ORAL | Status: DC

## 2015-03-23 MED ORDER — AMLODIPINE BESYLATE 10 MG PO TABS: 10.0000 mg | ORAL_TABLET | Freq: Every day | ORAL | Status: DC

## 2015-03-23 NOTE — Progress Notes (Signed)
   Subjective:    Patient ID: April Finley, female    DOB: 12/13/1975, 39 y.o.   MRN: [id number]  HPI Patient in today with c/o unexplained weight loss. Patient states onset of symptoms a few months.  Pt has lost sixty poiunds in the last eight months  No family hx of thyroid  States no other concerns this visit.  Pt was walking and exdrcising regulaly, has backed off some with that  No GI symptoms no pain no vom no nausea  Pt notes diminished appetite the past month, has ate less  Pt notes no major stress at home, some stress with death of fellow employee  Patient claims compliance with all blood pressure medication. No obvious side effects. Medications reviewed today  Review of Systems No headache no chest pain no back pain no change in bowel habits    Objective:   Physical Exam  Alert no acute distress HEENT normal. Lungs clear heart regular in rhythm. Neuro exam intact abdominal exam with chaperone completely benign      Assessment & Plan:  Impression weight loss etiology unclear #2 patient behind on preventive women's physicals [facility] and has not gone there for several years for this #3 hypertension decent control plan appropriate blood work. Strongly encouraged to get physical maintain same blood pressure medicine further recommendations based results encourage follow-up in a month to discontinue weight loss via home scales [facility]

## 2015-03-24 LAB — CBC WITH DIFFERENTIAL/PLATELET
BASOS ABS: 0 10*3/uL (ref 0.0–0.2)
BASOS: 1 %
EOS (ABSOLUTE): 0.2 10*3/uL (ref 0.0–0.4)
Eos: 4 %
HEMOGLOBIN: 8.6 g/dL — AB (ref 11.1–15.9)
Hematocrit: 31.5 % — ABNORMAL LOW (ref 34.0–46.6)
IMMATURE GRANS (ABS): 0 10*3/uL (ref 0.0–0.1)
Immature Granulocytes: 0 %
LYMPHS ABS: 1.9 10*3/uL (ref 0.7–3.1)
LYMPHS: 38 %
MCH: 17.1 pg — AB (ref 26.6–33.0)
MCHC: 27.3 g/dL — AB (ref 31.5–35.7)
MCV: 63 fL — AB (ref 79–97)
MONOCYTES: 9 %
Monocytes Absolute: 0.4 10*3/uL (ref 0.1–0.9)
NEUTROS ABS: 2.4 10*3/uL (ref 1.4–7.0)
Neutrophils: 48 %
Platelets: 466 10*3/uL — ABNORMAL HIGH (ref 150–379)
RBC: 5.03 x10E6/uL (ref 3.77–5.28)
RDW: 20.4 % — ABNORMAL HIGH (ref 12.3–15.4)
WBC: 4.9 10*3/uL (ref 3.4–10.8)

## 2015-03-24 LAB — HEPATIC FUNCTION PANEL
ALBUMIN: 4.1 g/dL (ref 3.5–5.5)
ALT: 7 IU/L (ref 0–32)
AST: 8 IU/L (ref 0–40)
Alkaline Phosphatase: 33 IU/L — ABNORMAL LOW (ref 39–117)
BILIRUBIN TOTAL: 0.2 mg/dL (ref 0.0–1.2)
Bilirubin, Direct: 0.07 mg/dL (ref 0.00–0.40)
TOTAL PROTEIN: 7.4 g/dL (ref 6.0–8.5)

## 2015-03-24 LAB — BASIC METABOLIC PANEL
BUN/Creatinine Ratio: 10 (ref 8–20)
BUN: 7 mg/dL (ref 6–20)
CALCIUM: 9 mg/dL (ref 8.7–10.2)
CO2: 21 mmol/L (ref 18–29)
CREATININE: 0.69 mg/dL (ref 0.57–1.00)
Chloride: 104 mmol/L (ref 97–106)
GFR, EST AFRICAN AMERICAN: 129 mL/min/{1.73_m2} (ref >59)
GFR, EST NON AFRICAN AMERICAN: 112 mL/min/{1.73_m2} (ref >59)
Glucose: 84 mg/dL (ref 65–99)
POTASSIUM: 4.3 mmol/L (ref 3.5–5.2)
Sodium: 140 mmol/L (ref 136–144)

## 2015-03-24 LAB — LIPID PANEL
CHOL/HDL RATIO: 2.7 ratio (ref 0.0–4.4)
Cholesterol, Total: 147 mg/dL (ref 100–199)
HDL: 55 mg/dL (ref >39)
LDL CALC: 77 mg/dL (ref 0–99)
Triglycerides: 74 mg/dL (ref 0–149)
VLDL CHOLESTEROL CAL: 15 mg/dL (ref 5–40)

## 2015-03-24 LAB — TSH: TSH: 1.02 u[IU]/mL (ref 0.450–4.500)

## 2015-03-24 LAB — T4: T4 TOTAL: 8.6 ug/dL (ref 4.5–12.0)

## 2015-03-26 ENCOUNTER — Other Ambulatory Visit: Payer: Self-pay | Admitting: *Deleted

## 2015-03-26 DIAGNOSIS — D649 Anemia, unspecified: Secondary | ICD-10-CM

## 2015-03-26 LAB — SPECIMEN STATUS REPORT

## 2015-03-29 LAB — SPECIMEN STATUS REPORT

## 2015-03-29 LAB — FERRITIN: Ferritin: 5 ng/mL — ABNORMAL LOW (ref 15–150)

## 2015-04-02 ENCOUNTER — Telehealth: Payer: Self-pay | Admitting: Family Medicine

## 2015-04-02 ENCOUNTER — Encounter: Payer: Self-pay | Admitting: Family Medicine

## 2015-04-02 ENCOUNTER — Ambulatory Visit (INDEPENDENT_AMBULATORY_CARE_PROVIDER_SITE_OTHER): Payer: BC Managed Care – PPO | Admitting: Family Medicine

## 2015-04-02 VITALS — BP 140/88 | Ht 65.0 in | Wt 164.2 lb

## 2015-04-02 DIAGNOSIS — I10 Essential (primary) hypertension: Secondary | ICD-10-CM

## 2015-04-02 DIAGNOSIS — D509 Iron deficiency anemia, unspecified: Secondary | ICD-10-CM

## 2015-04-02 MED ORDER — INDAPAMIDE 1.25 MG PO TABS: 1.2500 mg | ORAL_TABLET | Freq: Every day | ORAL | Status: DC

## 2015-04-02 NOTE — Patient Instructions (Signed)
Iron 325 mg one tablet twice a day with food  Do your heme occult cards  We will set you up with gastroenterology  You may need a iron infusion but it is necessary to have your intestines checked

## 2015-04-02 NOTE — Progress Notes (Signed)
   Subjective:    Patient ID: April Finley, female    DOB: 08/20/1975, 39 y.o.   MRN: [id number]  Anemia Presents for initial visit.    Patient in today to discuss recent lab work ordered on 03/23/15. Patient states no other concerns this visit   patient denies heavy cycles. She states her cycles are monthly 3-5 days and not heavy.   Patient does relate that she does not do a good job eating  she states she's stressed but denies being depressed Review of Systems  denies rectal bleeding denies heavy cycles denies abdominal pain vomiting diarrhea.    Objective:   Physical Exam   lungs clear heart regular pulse normal blood pressure and slight elevation      Assessment & Plan:   HTN-stop HCTZ. Use Lozol 1.25 every morning continue other medications. Recheck blood pressure within 2 months    severe iron deficient anemia with some weight loss Ash patient not doing adequate job of eating. She needs to improve caloric intake. At least 1500 cal daily. Iron supplementation 325 mg twice a day. It is quite possible that the patient is  Not an absorber of iron.   Referral to gastroenterology. Will do Hemoccult cards. Additional lab work. Patient may benefit from EGD and colonoscopy. She may end up needing iron infusions.   Recheck patient here in 2 months.

## 2015-04-02 NOTE — Telephone Encounter (Signed)
[facility] needs to pick up hemoccult cards x3 per Dr.Scott Luking sometime this week.)

## 2015-04-02 NOTE — Telephone Encounter (Signed)
Discussed with patient. Patient verbalized understanding. Patient states she will come by to pick up the hemoccult cards this afternoon or tomorrow.

## 2015-04-08 ENCOUNTER — Encounter (HOSPITAL_COMMUNITY): Payer: Self-pay | Admitting: *Deleted

## 2015-04-08 ENCOUNTER — Emergency Department (HOSPITAL_COMMUNITY): Payer: BC Managed Care – PPO

## 2015-04-08 ENCOUNTER — Emergency Department (HOSPITAL_COMMUNITY)
Admission: EM | Admit: 2015-04-08 | Discharge: 2015-04-08 | Disposition: A | Discharge status: Home / Self Care | Payer: BC Managed Care – PPO | Attending: Emergency Medicine | Admitting: Emergency Medicine

## 2015-04-08 DIAGNOSIS — M79672 Pain in left foot: Secondary | ICD-10-CM

## 2015-04-08 DIAGNOSIS — I10 Essential (primary) hypertension: Secondary | ICD-10-CM | POA: Diagnosis not present

## 2015-04-08 DIAGNOSIS — Z79899 Other long term (current) drug therapy: Secondary | ICD-10-CM | POA: Diagnosis not present

## 2015-04-08 DIAGNOSIS — Z880 Allergy status to penicillin: Secondary | ICD-10-CM | POA: Diagnosis not present

## 2015-04-08 NOTE — Discharge Instructions (Signed)
Take ibuprofen 400 mg 3 times a day with meals for pain. Use heat on the sore area 3 or 4 times a day.    Heat Therapy Heat therapy can help ease sore, stiff, injured, and tight muscles and joints. Heat relaxes your muscles, which may help ease your pain.  RISKS AND COMPLICATIONS If you have any of the following conditions, do not use heat therapy unless your health care provider has approved:  Poor circulation.  Healing wounds or scarred skin in the area being treated.  Diabetes, heart disease, or high blood pressure.  Not being able to feel (numbness) the area being treated.  Unusual swelling of the area being treated.  Active infections.  Blood clots.  Cancer.  Inability to communicate pain. This may include young children and people who have problems with their brain function (dementia).  Pregnancy. Heat therapy should only be used on old, pre-existing, or long-lasting (chronic) injuries. Do not use heat therapy on new injuries unless directed by your health care provider. HOW TO USE HEAT THERAPY There are several different kinds of heat therapy, including:  Moist heat pack.  Warm water bath.  Hot water bottle.  Electric heating pad.  Heated gel pack.  Heated wrap.  Electric heating pad. Use the heat therapy method suggested by your health care provider. Follow your health care provider's instructions on when and how to use heat therapy. GENERAL HEAT THERAPY RECOMMENDATIONS  Do not sleep while using heat therapy. Only use heat therapy while you are awake.  Your skin may turn pink while using heat therapy. Do not use heat therapy if your skin turns red.  Do not use heat therapy if you have new pain.  High heat or long exposure to heat can cause burns. Be careful when using heat therapy to avoid burning your skin.  Do not use heat therapy on areas of your skin that are already irritated, such as with a rash or sunburn. SEEK MEDICAL CARE IF:  You have  blisters, redness, swelling, or numbness.  You have new pain.  Your pain is worse. MAKE SURE YOU:  Understand these instructions.  Will watch your condition.  Will get help right away if you are not doing well or get worse.   This information is not intended to replace advice given to you by your health care provider. Make sure you discuss any questions you have with your health care provider.   Document Released: 07/21/2011 Document Revised: 05/19/2014 Document Reviewed: 06/21/2013 Elsevier Interactive Patient Education 2016 Elsevier Inc.  Musculoskeletal Pain Musculoskeletal pain is muscle and boney aches and pains. These pains can occur in any part of the body. Your caregiver may treat you without knowing the cause of the pain. They may treat you if blood or urine tests, X-rays, and other tests were normal.  CAUSES There is often not a definite cause or reason for these pains. These pains may be caused by a type of germ (virus). The discomfort may also come from overuse. Overuse includes working out too hard when your body is not fit. Boney aches also come from weather changes. Bone is sensitive to atmospheric pressure changes. HOME CARE INSTRUCTIONS   Ask when your test results will be ready. Make sure you get your test results.  Only take over-the-counter or prescription medicines for pain, discomfort, or fever as directed by your caregiver. If you were given medications for your condition, do not drive, operate machinery or power tools, or sign legal documents for  24 hours. Do not drink alcohol. Do not take sleeping pills or other medications that may interfere with treatment.  Continue all activities unless the activities cause more pain. When the pain lessens, slowly resume normal activities. Gradually increase the intensity and duration of the activities or exercise.  During periods of severe pain, bed rest may be helpful. Lay or sit in any position that is  comfortable.  Putting ice on the injured area.  Put ice in a bag.  Place a towel between your skin and the bag.  Leave the ice on for 15 to 20 minutes, 3 to 4 times a day.  Follow up with your caregiver for continued problems and no reason can be found for the pain. If the pain becomes worse or does not go away, it may be necessary to repeat tests or do additional testing. Your caregiver may need to look further for a possible cause. SEEK IMMEDIATE MEDICAL CARE IF:  You have pain that is getting worse and is not relieved by medications.  You develop chest pain that is associated with shortness or breath, sweating, feeling sick to your stomach (nauseous), or throw up (vomit).  Your pain becomes localized to the abdomen.  You develop any new symptoms that seem different or that concern you. MAKE SURE YOU:   Understand these instructions.  Will watch your condition.  Will get help right away if you are not doing well or get worse.   This information is not intended to replace advice given to you by your health care provider. Make sure you discuss any questions you have with your health care provider.   Document Released: 04/28/2005 Document Revised: 07/21/2011 Document Reviewed: 12/31/2012 Elsevier Interactive Patient Education [employer]

## 2015-04-08 NOTE — ED Notes (Signed)
Pt started having left foot pain 11/23.  Pt denies injury to foot. Pt ambulated to room

## 2015-04-08 NOTE — ED Provider Notes (Signed)
CSN: [id number]     Arrival date & time 04/08/15  4098 History  By signing my name below, I, April Finley, attest that this documentation has been prepared under the direction and in the presence of Mancel Bale, MD. Electronically Signed: Ronney Finley, ED Scribe. 04/08/2015. 11:01 AM.    Chief Complaint  Patient presents with  . Foot Pain   The history is provided by the patient. No language interpreter was used.    HPI Comments: April Finley is a 39 y.o. female who presents to the Emergency Department complaining of moderate left foot pain that only occurs with palpation or weight-bearing, with onset 4 days ago. She denies any known trauma or injury and states she was asymptomatic the days before her pain onset. She states she is able to move her toes without any pain. She denies any toe pain, ankle pain, knee pain, or back pain.   PCP Dr. Gerda Diss  Past Medical History  Diagnosis Date  . Hypertension    History reviewed. No pertinent past surgical history. Family History  Problem Relation Age of Onset  . Hypertension Mother   . Kidney disease Mother     lupus,HTN  . Hypertension Brother   . Heart disease Maternal Grandmother   . Hyperlipidemia Maternal Grandmother   . Hypertension Maternal Grandmother   . Cancer Maternal Grandfather     colon   Social History  Substance Use Topics  . Smoking status: Never Smoker   . Smokeless tobacco: None  . Alcohol Use: No   OB History    No data available     Review of Systems  All other systems reviewed and are negative.  Allergies  Penicillins  Home Medications   Prior to Admission medications   Medication Sig Start Date End Date Taking? Authorizing Provider  ALPRAZolam Prudy Feeler) 1 MG tablet Take 1 tablet (1 mg total) by mouth at bedtime as needed for anxiety. 06/19/14  Yes Babs Sciara, MD  amLODipine (NORVASC) 10 MG tablet Take 1 tablet (10 mg total) by mouth daily. 03/23/15 03/22/16 Yes Merlyn Albert, MD   hydrochlorothiazide (HYDRODIURIL) 25 MG tablet Take 1 tablet by mouth daily. 03/23/15  Yes Historical Provider, MD  ibuprofen (ADVIL,MOTRIN) 200 MG tablet Take 400 mg by mouth every 6 (six) hours as needed for moderate pain.   Yes Historical Provider, MD  isometheptene-acetaminophen-dichloralphenazone (MIDRIN) 65-100-325 MG capsule TAKE 2 CAPSULES BY MOUTH AT ONSET OF MIGRAINE, MAY REPEAT EVERY HOUR UNTIL RELIEF, MAX 5 IN 12 HRS 02/22/15  Yes Babs Sciara, MD  lisinopril (PRINIVIL,ZESTRIL) 5 MG tablet Take 1 tablet (5 mg total) by mouth every morning. 03/23/15  Yes Merlyn Albert, MD  fluticasone (FLONASE) 50 MCG/ACT nasal spray Place 2 sprays into both nostrils daily. Patient not taking: Reported on 04/08/2015 03/23/15   Merlyn Albert, MD  indapamide (LOZOL) 1.25 MG tablet Take 1 tablet (1.25 mg total) by mouth daily. Patient not taking: Reported on 04/08/2015 04/02/15   Babs Sciara, MD   BP 141/97 mmHg  Pulse 90  Temp(Src) 98.1 F (36.7 C) (Oral)  Resp 16  Ht  (1.651 m)  Wt 150 lb (68.04 kg)  BMI 24.96 kg/m2  SpO2 100%  LMP 03/06/2015 Physical Exam  Constitutional: She is oriented to person, place, and time. She appears well-developed and well-nourished.  HENT:  Head: Normocephalic and atraumatic.  Eyes: Conjunctivae and EOM are normal. Pupils are equal, round, and reactive to light.  Neck: Normal range  of motion and phonation normal. Neck supple.  Cardiovascular: Normal rate and regular rhythm.   Pulmonary/Chest: Effort normal and breath sounds normal. She exhibits no tenderness.  Abdominal: Soft. She exhibits no distension. There is no tenderness. There is no guarding.  Musculoskeletal: Normal range of motion. She exhibits tenderness.  Tender at the second and third distal metatarsal.   Neurological: She is alert and oriented to person, place, and time. She exhibits normal muscle tone.  Skin: Skin is warm and dry.  Psychiatric: She has a normal mood and affect.  Her behavior is normal. Judgment and thought content normal.  Nursing note and vitals reviewed.   ED Course  Procedures (including critical care time)  DIAGNOSTIC STUDIES: Oxygen Saturation is 100% on RA, normal by my interpretation.    COORDINATION OF CARE: 10:17 AM - Discussed treatment plan with pt at bedside which includes left foot XR. Pt verbalized understanding and agreed to plan.   10:57 AM - XR findings discussed with pt. Will place pt's foot in a soft shoe. Advised rest and ibuprofen prn for pain. If pain persists or worsens, instructed pt to f/u with PCP. Will give work note for 3 days. Pt verbalized understanding and agreed to plan.    Imaging Review Dg Foot Complete Left  04/08/2015  CLINICAL DATA:  Left foot pain plantar surface near ball of foot 5 days. No injury. EXAM: LEFT FOOT - COMPLETE 3+ VIEW COMPARISON:  None. FINDINGS: There is no evidence of fracture or dislocation. There is no evidence of arthropathy or other focal bone abnormality. Soft tissues are unremarkable. IMPRESSION: Negative. Electronically Signed   By: Elberta Fortisaniel  Boyle M.D.   On: 04/08/2015 10:50   I have personally reviewed and evaluated these images and lab results as part of my medical decision-making.  MDM   Final diagnoses:  Foot pain, left    Nonspecific left foot pain, without injury. Doubt fracture, tendinitis, or septic arthritis.  Nursing Notes Reviewed/ Care Coordinated Applicable Imaging Reviewed Interpretation of Laboratory Data incorporated into ED treatment  The patient appears reasonably screened and/or stabilized for discharge and I doubt any other medical condition or other [facility] requiring further screening, evaluation, or treatment in the ED at this time prior to discharge.  Plan: Home Medications- IBU; Home Treatments- rest; return here if the recommended treatment, does not improve the symptoms; Recommended follow up- PCP prn   I personally performed the services described in  this documentation, which was scribed in my presence. The recorded information has been reviewed and is accurate.      Mancel BaleElliott Donya Hitch, MD 04/08/15 [phone]

## 2015-04-09 ENCOUNTER — Encounter (INDEPENDENT_AMBULATORY_CARE_PROVIDER_SITE_OTHER): Payer: Self-pay | Admitting: *Deleted

## 2015-05-15 ENCOUNTER — Ambulatory Visit (INDEPENDENT_AMBULATORY_CARE_PROVIDER_SITE_OTHER): Payer: BC Managed Care – PPO | Admitting: Internal Medicine

## 2015-05-15 ENCOUNTER — Encounter (INDEPENDENT_AMBULATORY_CARE_PROVIDER_SITE_OTHER): Payer: Self-pay | Admitting: *Deleted

## 2015-05-23 ENCOUNTER — Ambulatory Visit: Payer: BC Managed Care – PPO | Admitting: Family Medicine

## 2015-05-23 DIAGNOSIS — Z029 Encounter for administrative examinations, unspecified: Secondary | ICD-10-CM

## 2015-05-29 ENCOUNTER — Encounter: Payer: Self-pay | Admitting: Family Medicine

## 2015-07-03 ENCOUNTER — Encounter: Payer: Self-pay | Admitting: Family Medicine

## 2015-09-21 ENCOUNTER — Ambulatory Visit: Payer: BC Managed Care – PPO | Admitting: Family Medicine

## 2015-12-04 ENCOUNTER — Other Ambulatory Visit: Payer: Self-pay | Admitting: Family Medicine

## 2016-03-23 ENCOUNTER — Other Ambulatory Visit: Payer: Self-pay | Admitting: Family Medicine

## 2016-03-24 ENCOUNTER — Other Ambulatory Visit: Payer: Self-pay | Admitting: Family Medicine

## 2016-03-24 NOTE — Telephone Encounter (Signed)
May have one refill needs office visit 

## 2016-03-24 NOTE — Telephone Encounter (Signed)
This appears to be a duplicate, may have one refill, needs office visit

## 2016-03-26 ENCOUNTER — Telehealth: Payer: Self-pay | Admitting: Family Medicine

## 2016-03-26 NOTE — Telephone Encounter (Signed)
Spoke with patient and informed her that a refill was sent over on her medication to  [facility]. Patient verbalized understanding.

## 2016-03-26 NOTE — Telephone Encounter (Signed)
[facility] [place] [facility] Finley that [facility] do not have the Rx for Alprazolam that was sent over this week.  Can we resend?

## 2016-04-14 ENCOUNTER — Other Ambulatory Visit: Payer: Self-pay | Admitting: *Deleted

## 2016-04-14 DIAGNOSIS — Z0289 Encounter for other administrative examinations: Secondary | ICD-10-CM

## 2016-04-14 MED ORDER — ALPRAZOLAM 1 MG PO TABS: 1.0000 mg | ORAL_TABLET | Freq: Every evening | ORAL | 0 refills | Status: DC | PRN

## 2016-04-15 ENCOUNTER — Ambulatory Visit: Payer: BC Managed Care – PPO | Admitting: Nurse Practitioner

## 2016-06-10 ENCOUNTER — Encounter: Payer: Self-pay | Admitting: Family Medicine

## 2016-06-22 ENCOUNTER — Other Ambulatory Visit: Payer: Self-pay | Admitting: Family Medicine

## 2016-06-23 NOTE — Telephone Encounter (Signed)
One refill needs office visit 

## 2016-06-23 NOTE — Telephone Encounter (Signed)
Last seen 04/02/15

## 2016-06-24 ENCOUNTER — Other Ambulatory Visit: Payer: Self-pay | Admitting: Family Medicine

## 2016-06-24 NOTE — Telephone Encounter (Signed)
Last seen 04/02/15

## 2016-12-09 ENCOUNTER — Other Ambulatory Visit: Payer: Self-pay | Admitting: Family Medicine

## 2016-12-10 ENCOUNTER — Other Ambulatory Visit: Payer: Self-pay | Admitting: Family Medicine

## 2017-02-04 ENCOUNTER — Emergency Department (HOSPITAL_COMMUNITY)
Admission: EM | Admit: 2017-02-04 | Discharge: 2017-02-04 | Disposition: A | Discharge status: Home / Self Care | Payer: BC Managed Care – PPO | Attending: Emergency Medicine | Admitting: Emergency Medicine

## 2017-02-04 ENCOUNTER — Encounter (HOSPITAL_COMMUNITY): Payer: Self-pay | Admitting: Emergency Medicine

## 2017-02-04 DIAGNOSIS — I10 Essential (primary) hypertension: Secondary | ICD-10-CM | POA: Insufficient documentation

## 2017-02-04 DIAGNOSIS — Z79899 Other long term (current) drug therapy: Secondary | ICD-10-CM | POA: Diagnosis not present

## 2017-02-04 DIAGNOSIS — R51 Headache: Secondary | ICD-10-CM | POA: Diagnosis present

## 2017-02-04 DIAGNOSIS — G43009 Migraine without aura, not intractable, without status migrainosus: Secondary | ICD-10-CM | POA: Diagnosis not present

## 2017-02-04 HISTORY — DX: Migraine, unspecified, not intractable, without status migrainosus: G43.909

## 2017-02-04 MED ORDER — DIPHENHYDRAMINE HCL 12.5 MG/5ML PO ELIX
ORAL_SOLUTION | ORAL | Status: AC
  Filled 2017-02-04: qty 10

## 2017-02-04 MED ORDER — KETOROLAC TROMETHAMINE 60 MG/2ML IM SOLN
60.0000 mg | Freq: Once | INTRAMUSCULAR | Status: AC
  Administered 2017-02-04: 60 mg via INTRAMUSCULAR
  Filled 2017-02-04: qty 2

## 2017-02-04 MED ORDER — DIPHENHYDRAMINE HCL 12.5 MG/5ML PO ELIX
25.0000 mg | ORAL_SOLUTION | Freq: Once | ORAL | Status: AC
  Administered 2017-02-04: 25 mg via ORAL

## 2017-02-04 MED ORDER — DIPHENHYDRAMINE HCL 25 MG PO CAPS
25.0000 mg | ORAL_CAPSULE | Freq: Once | ORAL | Status: DC
  Filled 2017-02-04: qty 1

## 2017-02-04 MED ORDER — METOCLOPRAMIDE HCL 5 MG/ML IJ SOLN
10.0000 mg | Freq: Once | INTRAMUSCULAR | Status: AC
  Administered 2017-02-04: 10 mg via INTRAMUSCULAR
  Filled 2017-02-04: qty 2

## 2017-02-04 NOTE — ED Triage Notes (Signed)
Pt hx of migraines. Used to take midrin but does not have any. Migraine since yesterday with n/v x 1 today, has eaten since with no vomiting. Denies dizziness. Nad.

## 2017-02-04 NOTE — ED Provider Notes (Signed)
[facility]-EM[facility]Y DEPT Provider Note   CSN: [id number] Arrival date & time: 02/04/17  1416     History   Chief Complaint Chief Complaint  Patient presents with  . Headache    HPI April Finley is a 41 y.o. female.  HPI   April Finley is a 41 y.o. female who presents to the Emergency Department complaining of gradual onset of frontal headache for one day. Patient describes a throbbing sensation to her for head and behind both eyes. She states headache is similar to previous. States that she usually takes Midrin for her headaches but she has recently ran out of her prescription. She does have a refill but has been unable to get the medication filled due to finances.  She went episode of vomiting earlier today, but has eaten and drank fluids since then without further vomiting. She denies recent illness, fever, neck pain or stiffness, visual changes, and dizziness.      Past Medical History:  Diagnosis Date  . Hypertension   . Migraine     Patient Active Problem List   Diagnosis Date Noted  . HTN (hypertension), benign 06/01/2013    History reviewed. No pertinent surgical history.  OB History    No data available       Home Medications    Prior to Admission medications   Medication Sig Start Date End Date Taking? Authorizing Provider  ALPRAZolam Prudy Feeler) 1 MG tablet Take 1 tablet (1 mg total) by mouth at bedtime as needed. for sleep 04/14/16   Babs Sciara, MD  amLODipine (NORVASC) 10 MG tablet Take 1 tablet (10 mg total) by mouth daily. 03/23/15 03/22/16  Merlyn Albert, MD  fluticasone (FLONASE) 50 MCG/ACT nasal spray Place 2 sprays into both nostrils daily. Patient not taking: Reported on 04/08/2015 03/23/15   Merlyn Albert, MD  hydrochlorothiazide (HYDRODIURIL) 25 MG tablet Take 1 tablet by mouth daily. 03/23/15   [provider]  ibuprofen (ADVIL,MOTRIN) 200 MG tablet Take 400 mg by mouth every 6 (six) hours as needed for moderate pain.     [provider]  indapamide (LOZOL) 1.25 MG tablet Take 1 tablet (1.25 mg total) by mouth daily. Patient not taking: Reported on 04/08/2015 04/02/15   Babs Sciara, MD  isometheptene-acetaminophen-dichloralphenazone (MIDRIN) [phone] MG capsule TAKE 2 CAPSULES BY MOUTH AT ONSET OF MIGRAINE. MAY REPEAT EVERY HOUR UNTIL RELIEF. MAX 5 A DAY 06/24/16   Babs Sciara, MD  lisinopril (PRINIVIL,ZESTRIL) 5 MG tablet Take 1 tablet (5 mg total) by mouth every morning. 03/23/15   Merlyn Albert, MD    Family History Family History  Problem Relation Age of Onset  . Hypertension Mother   . Kidney disease Mother        lupus,HTN  . Hypertension Brother   . Heart disease Maternal Grandmother   . Hyperlipidemia Maternal Grandmother   . Hypertension Maternal Grandmother   . Cancer Maternal Grandfather        colon    Social History Social History  Substance Use Topics  . Smoking status: Never Smoker  . Smokeless tobacco: Never Used  . Alcohol use No     Allergies   Penicillins   Review of Systems Review of Systems  Constitutional: Negative for activity change, appetite change and fever.  HENT: Negative for facial swelling and trouble swallowing.   Eyes: Positive for photophobia. Negative for pain and visual disturbance.  Respiratory: Negative for shortness of breath.   Cardiovascular: Negative for  chest pain.  Gastrointestinal: Negative for nausea and vomiting.  Musculoskeletal: Negative for neck pain and neck stiffness.  Skin: Negative for rash and wound.  Neurological: Positive for headaches. Negative for dizziness, facial asymmetry, speech difficulty, weakness and numbness.  Psychiatric/Behavioral: Negative for confusion and decreased concentration.  All other systems reviewed and are negative.    Physical Exam Updated Vital Signs BP (!) 172/104 (BP Location: Right Arm)   Pulse 80   Temp 98.5 F (36.9 C) (Oral)   Resp 17   LMP 01/17/2017   SpO2 100%    Physical Exam  Constitutional: She is oriented to person, place, and time. She appears well-developed and well-nourished. No distress.  HENT:  Head: Normocephalic and atraumatic.  Mouth/Throat: Oropharynx is clear and moist.  Eyes: Pupils are equal, round, and reactive to light. EOM are normal.  Neck: Normal range of motion and phonation normal. Neck supple. No spinous process tenderness and no muscular tenderness present. No neck rigidity. No Kernig's sign noted.  Cardiovascular: Normal rate, regular rhythm and intact distal pulses.   No murmur heard. Pulmonary/Chest: Effort normal and breath sounds normal. No respiratory distress.  Abdominal: Soft. She exhibits no distension. There is no tenderness.  Musculoskeletal: Normal range of motion.  Neurological: She is alert and oriented to person, place, and time. She has normal strength. No cranial nerve deficit or sensory deficit. She exhibits normal muscle tone. Coordination and gait normal. GCS eye subscore is 4. GCS verbal subscore is 5. GCS motor subscore is 6.  Reflex Scores:      Tricep reflexes are 2+ on the right side and 2+ on the left side.      Bicep reflexes are 2+ on the right side and 2+ on the left side. CN III-XII intact  Skin: Skin is warm and dry. Capillary refill takes less than 2 seconds. No rash noted.  Psychiatric: She has a normal mood and affect.  Nursing note and vitals reviewed.    ED Treatments / Results  Labs (all labs ordered are listed, but only abnormal results are displayed) Labs Reviewed - No data to display  EKG  EKG Interpretation None       Radiology No results found.  Procedures Procedures (including critical care time)  Medications Ordered in ED Medications  ketorolac (TORADOL) injection 60 mg (not administered)  metoCLOPramide (REGLAN) injection 10 mg (not administered)  diphenhydrAMINE (BENADRYL) capsule 25 mg (not administered)     Initial Impression / Assessment and Plan /  ED Course  I have reviewed the triage vital signs and the nursing notes.  Pertinent labs & imaging results that were available during my care of the patient were reviewed by me and considered in my medical decision making (see chart for details).     Patient will appearing. Nontoxic. No focal neuro deficits. No nuchal rigidity. Headache is similar to previous. Improved after IM medication.  On recheck, pt is feeling better, headache has improved.     Appears stable for discharge. Agrees to PCP follow-up.  Final Clinical Impressions(s) / ED Diagnoses   Final diagnoses:  Migraine without aura and without status migrainosus, not intractable    New Prescriptions New Prescriptions   No medications on file     Pauline Aus, Cordelia Poche 02/04/17 1644    Donnetta Hutching, MD 02/05/17 [phone]

## 2017-02-04 NOTE — Discharge Instructions (Signed)
Follow-up with your primary doctor for recheck °

## 2017-06-30 ENCOUNTER — Other Ambulatory Visit: Payer: Self-pay

## 2017-06-30 ENCOUNTER — Emergency Department (HOSPITAL_COMMUNITY)
Admission: EM | Admit: 2017-06-30 | Discharge: 2017-06-30 | Disposition: A | Discharge status: Home / Self Care | Payer: BC Managed Care – PPO | Attending: Emergency Medicine | Admitting: Emergency Medicine

## 2017-06-30 ENCOUNTER — Encounter (HOSPITAL_COMMUNITY): Payer: Self-pay | Admitting: Emergency Medicine

## 2017-06-30 DIAGNOSIS — Z5321 Procedure and treatment not carried out due to patient leaving prior to being seen by health care provider: Secondary | ICD-10-CM | POA: Diagnosis not present

## 2017-06-30 DIAGNOSIS — G43909 Migraine, unspecified, not intractable, without status migrainosus: Secondary | ICD-10-CM | POA: Insufficient documentation

## 2017-06-30 NOTE — ED Triage Notes (Signed)
Reports migraine since yesterday morning.  Has taken bc power and Excedrin migraine with no relief.

## 2017-06-30 NOTE — ED Notes (Signed)
Called to triage x 2 with no answer  

## 2017-08-22 ENCOUNTER — Other Ambulatory Visit: Payer: Self-pay | Admitting: Family Medicine

## 2017-08-25 ENCOUNTER — Other Ambulatory Visit: Payer: Self-pay | Admitting: Family Medicine

## 2017-08-25 NOTE — Telephone Encounter (Signed)
It has been too long since seeing this patient to be able to fill this prescription will need office visit

## 2017-09-02 ENCOUNTER — Encounter: Payer: Self-pay | Admitting: Nurse Practitioner

## 2017-09-02 ENCOUNTER — Ambulatory Visit (INDEPENDENT_AMBULATORY_CARE_PROVIDER_SITE_OTHER): Payer: BC Managed Care – PPO | Admitting: Nurse Practitioner

## 2017-09-02 VITALS — BP 138/78 | Ht 64.0 in | Wt 185.0 lb

## 2017-09-02 DIAGNOSIS — I10 Essential (primary) hypertension: Secondary | ICD-10-CM | POA: Diagnosis not present

## 2017-09-02 DIAGNOSIS — Z13220 Encounter for screening for lipoid disorders: Secondary | ICD-10-CM

## 2017-09-02 DIAGNOSIS — Z79899 Other long term (current) drug therapy: Secondary | ICD-10-CM | POA: Diagnosis not present

## 2017-09-02 DIAGNOSIS — R5383 Other fatigue: Secondary | ICD-10-CM

## 2017-09-02 DIAGNOSIS — R220 Localized swelling, mass and lump, head: Secondary | ICD-10-CM | POA: Diagnosis not present

## 2017-09-02 MED ORDER — LISINOPRIL 5 MG PO TABS: 5.0000 mg | ORAL_TABLET | ORAL | 5 refills | Status: DC

## 2017-09-02 MED ORDER — LEVOFLOXACIN 500 MG PO TABS: 500.0000 mg | ORAL_TABLET | Freq: Every day | ORAL | 0 refills | Status: DC

## 2017-09-02 MED ORDER — PREDNISONE 20 MG PO TABS: ORAL_TABLET | ORAL | 0 refills | Status: DC

## 2017-09-02 MED ORDER — HYDROCHLOROTHIAZIDE 25 MG PO TABS
25.0000 mg | ORAL_TABLET | Freq: Every day | ORAL | 5 refills | Status: DC
Start: 2017-09-02 — End: 2018-10-06

## 2017-09-02 MED ORDER — AMLODIPINE BESYLATE 10 MG PO TABS: 10.0000 mg | ORAL_TABLET | Freq: Every day | ORAL | 5 refills | Status: DC

## 2017-09-03 ENCOUNTER — Encounter: Payer: Self-pay | Admitting: Nurse Practitioner

## 2017-09-03 NOTE — Progress Notes (Signed)
Subjective: Presents for routine follow-up on her hypertension.  Gets regular preventive health physicals including Pap smear at her gynecologist.  Has a very active job.  Rare use of her Xanax, usually only takes a half a pill at a time.  Her main problem with her diet is intake of regular soda.  Takes an occasional Midrin for her migraines.  Also caffeine intake with anti-inflammatory has helped.  About a week ago was given clindamycin by urgent care for a possible right maxillary sinusitis.  Patient denies any dental issues.  Had pain along the right cheekbone into the teeth.  After taking several doses of clindamycin began having itching all over with some rash.  Has greatly improved since she stopped taking this.  Continues to have swelling in the right mid facial area although this has improved.  No fever. Denies CP/ischemic type pain or SOB. No visual changes. No difficulty speaking or swallowing. No numbness or weakness of the face, arms or legs.   Depression screen PHQ 2/9 09/02/2017  Decreased Interest 1  Down, Depressed, Hopeless 1  PHQ - 2 Score 2   Describes her stress level is improving, patient has been divorced for about a year.  Objective:   BP 138/78   Ht 5\' 4"  (1.626 m)   Wt 185 lb (83.9 kg)   BMI 31.76 kg/m  NAD.  Alert, oriented.  TMs mildly retracted, no erythema.  Pharynx non erythematous with faint green PND noted.  Mild edema noted along the right maxillary area, no erythema or warmth.  Minimally tender.  Neck supple with mild soft anterior adenopathy.  In examination of her teeth on the right upper side show 2 small areas of erythema near the back with mild tenderness, no edema.  Lungs clear.  Heart regular rate and rhythm.  No murmur or gallop noted.  Carotids no bruits or thrills.  Lower extremities no edema.  Assessment:   Problem List Items Addressed This Visit      Cardiovascular and Mediastinum   HTN (hypertension), benign - Primary   Relevant Medications   amLODipine (NORVASC) 10 MG tablet   hydrochlorothiazide (HYDRODIURIL) 25 MG tablet   lisinopril (PRINIVIL,ZESTRIL) 5 MG tablet   Other Relevant Orders   Basic metabolic panel    Other Visit Diagnoses    Right facial swelling       Fatigue, unspecified type       Relevant Orders   CBC with Differential/Platelet   TSH   VITAMIN D 25 Hydroxy (Vit-D Deficiency, Fractures)   Lipid screening       Relevant Orders   Lipid panel   High risk medication use       Relevant Orders   Hepatic function panel       Plan:   Meds ordered this encounter  Medications  . amLODipine (NORVASC) 10 MG tablet    Sig: Take 1 tablet (10 mg total) by mouth daily.    Dispense:  30 tablet    Refill:  5    Order Specific Question:   Supervising Provider    Answer:   Merlyn AlbertLUKING, WILLIAM S [[id number]]  . hydrochlorothiazide (HYDRODIURIL) 25 MG tablet    Sig: Take 1 tablet (25 mg total) by mouth daily.    Dispense:  30 tablet    Refill:  5    Order Specific Question:   Supervising Provider    Answer:   Merlyn AlbertLUKING, WILLIAM S [[id number]]  . lisinopril (PRINIVIL,ZESTRIL) 5 MG tablet    Sig:  Take 1 tablet (5 mg total) by mouth every morning.    Dispense:  30 tablet    Refill:  5    Order Specific Question:   Supervising Provider    Answer:   Merlyn Albert [[id number]]  . levofloxacin (LEVAQUIN) 500 MG tablet    Sig: Take 1 tablet (500 mg total) by mouth daily.    Dispense:  10 tablet    Refill:  0    Order Specific Question:   Supervising Provider    Answer:   Merlyn Albert [[id number]]  . predniSONE (DELTASONE) 20 MG tablet    Sig: 3 po qd x 3 d then 2 po qd x 3 d then 1 po qd x 2 d    Dispense:  17 tablet    Refill:  0    Order Specific Question:   Supervising Provider    Answer:   Riccardo Dubin   Continue current medications as directed for blood pressure.  Encouraged regular activity and continued weight loss efforts.  Mainly encourage patient to slowly cut back on all sugary beverages such as regular  soda.  Routine labs pending.  Start Levaquin as directed for facial swelling.  It is unclear at this time how much of this is sinusitis or oral infection.  Prednisone as directed for recent possible drug reaction as well as swelling.  Warning signs reviewed.  Call back in 4 to 5 days if no improvement, sooner if worse. Return in about 6 months (around 03/04/2018). 25 minutes was spent with the patient.  This statement verifies that 25 minutes was indeed spent with the patient. Greater than half the time was spent in discussion, counseling and answering questions  regarding the issues that the patient came in for today as reflected in the diagnosis (s) please refer to documentation for further details.

## 2017-09-08 ENCOUNTER — Other Ambulatory Visit: Payer: Self-pay | Admitting: Nurse Practitioner

## 2017-09-08 LAB — CBC WITH DIFFERENTIAL/PLATELET
Basophils Absolute: 0 10*3/uL (ref 0.0–0.2)
Basos: 0 %
EOS (ABSOLUTE): 0.1 10*3/uL (ref 0.0–0.4)
Eos: 2 %
Hematocrit: 31.5 % — ABNORMAL LOW (ref 34.0–46.6)
Hemoglobin: 8.4 g/dL — ABNORMAL LOW (ref 11.1–15.9)
Immature Grans (Abs): 0 10*3/uL (ref 0.0–0.1)
Immature Granulocytes: 0 %
Lymphocytes Absolute: 2.8 10*3/uL (ref 0.7–3.1)
Lymphs: 42 %
MCH: 15.5 pg — AB (ref 26.6–33.0)
MCHC: 26.7 g/dL — ABNORMAL LOW (ref 31.5–35.7)
MCV: 58 fL — AB (ref 79–97)
MONOS ABS: 0.5 10*3/uL (ref 0.1–0.9)
Monocytes: 7 %
NEUTROS ABS: 3.4 10*3/uL (ref 1.4–7.0)
Neutrophils: 49 %
PLATELETS: 533 10*3/uL — AB (ref 150–379)
RBC: 5.43 x10E6/uL — ABNORMAL HIGH (ref 3.77–5.28)
RDW: 20.7 % — AB (ref 12.3–15.4)
WBC: 6.8 10*3/uL (ref 3.4–10.8)

## 2017-09-08 LAB — TSH: TSH: 0.674 u[IU]/mL (ref 0.450–4.500)

## 2017-09-08 LAB — BASIC METABOLIC PANEL
BUN / CREAT RATIO: 13 (ref 9–23)
BUN: 14 mg/dL (ref 6–24)
CHLORIDE: 101 mmol/L (ref 96–106)
CO2: 21 mmol/L (ref 20–29)
Calcium: 9.1 mg/dL (ref 8.7–10.2)
Creatinine, Ser: 1.08 mg/dL — ABNORMAL HIGH (ref 0.57–1.00)
GFR calc non Af Amer: 64 mL/min/{1.73_m2} (ref >59)
GFR, EST AFRICAN AMERICAN: 74 mL/min/{1.73_m2} (ref >59)
Glucose: 91 mg/dL (ref 65–99)
POTASSIUM: 4.1 mmol/L (ref 3.5–5.2)
Sodium: 138 mmol/L (ref 134–144)

## 2017-09-08 LAB — HEPATIC FUNCTION PANEL
ALBUMIN: 4.3 g/dL (ref 3.5–5.5)
ALK PHOS: 45 IU/L (ref 39–117)
ALT: 6 IU/L (ref 0–32)
AST: 13 IU/L (ref 0–40)
Bilirubin Total: 0.4 mg/dL (ref 0.0–1.2)
Bilirubin, Direct: 0.09 mg/dL (ref 0.00–0.40)
TOTAL PROTEIN: 8.4 g/dL (ref 6.0–8.5)

## 2017-09-08 LAB — LIPID PANEL
Chol/HDL Ratio: 2.9 ratio (ref 0.0–4.4)
Cholesterol, Total: 158 mg/dL (ref 100–199)
HDL: 54 mg/dL (ref >39)
LDL Calculated: 82 mg/dL (ref 0–99)
Triglycerides: 111 mg/dL (ref 0–149)
VLDL CHOLESTEROL CAL: 22 mg/dL (ref 5–40)

## 2017-09-08 LAB — VITAMIN D 25 HYDROXY (VIT D DEFICIENCY, FRACTURES): Vit D, 25-Hydroxy: 7.5 ng/mL — ABNORMAL LOW (ref 30.0–100.0)

## 2017-09-08 MED ORDER — VITAMIN D (ERGOCALCIFEROL) 1.25 MG (50000 UNIT) PO CAPS: 50000.0000 [IU] | ORAL_CAPSULE | ORAL | 5 refills | Status: DC

## 2017-09-10 ENCOUNTER — Other Ambulatory Visit: Payer: Self-pay | Admitting: Nurse Practitioner

## 2017-09-10 DIAGNOSIS — D509 Iron deficiency anemia, unspecified: Secondary | ICD-10-CM | POA: Insufficient documentation

## 2017-09-22 ENCOUNTER — Encounter (INDEPENDENT_AMBULATORY_CARE_PROVIDER_SITE_OTHER): Payer: Self-pay

## 2017-09-22 ENCOUNTER — Other Ambulatory Visit (HOSPITAL_COMMUNITY): Payer: Self-pay | Admitting: Emergency Medicine

## 2017-09-22 ENCOUNTER — Encounter: Payer: Self-pay | Admitting: Family Medicine

## 2017-09-22 DIAGNOSIS — D509 Iron deficiency anemia, unspecified: Secondary | ICD-10-CM

## 2017-10-14 ENCOUNTER — Inpatient Hospital Stay (HOSPITAL_COMMUNITY): Payer: BC Managed Care – PPO | Attending: Anesthesiology

## 2017-10-14 DIAGNOSIS — F5089 Other specified eating disorder: Secondary | ICD-10-CM | POA: Insufficient documentation

## 2017-10-14 DIAGNOSIS — D509 Iron deficiency anemia, unspecified: Secondary | ICD-10-CM

## 2017-10-14 DIAGNOSIS — I10 Essential (primary) hypertension: Secondary | ICD-10-CM | POA: Insufficient documentation

## 2017-10-14 LAB — CBC WITH DIFFERENTIAL/PLATELET
BASOS ABS: 0 10*3/uL (ref 0.0–0.1)
Basophils Relative: 0 %
EOS PCT: 5 %
Eosinophils Absolute: 0.3 10*3/uL (ref 0.0–0.7)
HCT: 29.9 % — ABNORMAL LOW (ref 36.0–46.0)
HEMOGLOBIN: 8.4 g/dL — AB (ref 12.0–15.0)
LYMPHS ABS: 2.3 10*3/uL (ref 0.7–4.0)
Lymphocytes Relative: 42 %
MCH: 16.6 pg — ABNORMAL LOW (ref 26.0–34.0)
MCHC: 28.1 g/dL — AB (ref 30.0–36.0)
MCV: 59 fL — AB (ref 78.0–100.0)
MONO ABS: 0.5 10*3/uL (ref 0.1–1.0)
Monocytes Relative: 9 %
NEUTROS ABS: 2.3 10*3/uL (ref 1.7–7.7)
Neutrophils Relative %: 44 %
Platelets: 402 10*3/uL — ABNORMAL HIGH (ref 150–400)
RBC: 5.07 MIL/uL (ref 3.87–5.11)
RDW: 22.5 % — AB (ref 11.5–15.5)
WBC: 5.5 10*3/uL (ref 4.0–10.5)

## 2017-10-14 LAB — IRON AND TIBC
Iron: 16 ug/dL — ABNORMAL LOW (ref 28–170)
SATURATION RATIOS: 4 % — AB (ref 10.4–31.8)
TIBC: 424 ug/dL (ref 250–450)
UIBC: 408 ug/dL

## 2017-10-14 LAB — FOLATE: FOLATE: 7.6 ng/mL (ref >5.9)

## 2017-10-14 LAB — FERRITIN: FERRITIN: 2 ng/mL — AB (ref 11–307)

## 2017-10-19 ENCOUNTER — Other Ambulatory Visit: Payer: Self-pay

## 2017-10-19 ENCOUNTER — Inpatient Hospital Stay (HOSPITAL_BASED_OUTPATIENT_CLINIC_OR_DEPARTMENT_OTHER): Payer: BC Managed Care – PPO | Admitting: Internal Medicine

## 2017-10-19 ENCOUNTER — Encounter (HOSPITAL_COMMUNITY): Payer: Self-pay | Admitting: Internal Medicine

## 2017-10-19 VITALS — BP 161/109 | HR 99 | Temp 98.6°F | Resp 18 | Ht 64.0 in | Wt 184.9 lb

## 2017-10-19 DIAGNOSIS — D509 Iron deficiency anemia, unspecified: Secondary | ICD-10-CM | POA: Diagnosis not present

## 2017-10-19 DIAGNOSIS — F5089 Other specified eating disorder: Secondary | ICD-10-CM

## 2017-10-19 DIAGNOSIS — I10 Essential (primary) hypertension: Secondary | ICD-10-CM

## 2017-10-19 NOTE — Patient Instructions (Signed)
[facility] Discharge Instructions  Today you saw Dr. Higgs.    Thank you for choosing  Cancer Center at [facility] to provide your oncology and hematology care.  To afford each patient quality time with our provider, please arrive at least 15 minutes before your scheduled appointment time.   If you have a lab appointment with the Cancer Center please come in thru the  Main Entrance and check in at the main information desk  You need to re-schedule your appointment should you arrive 10 or more minutes late.  We strive to give you quality time with our providers, and arriving late affects you and other patients whose appointments are after yours.  Also, if you no show three or more times for appointments you may be dismissed from the clinic at the providers discretion.     Again, thank you for choosing [facility].  Our hope is that these requests will decrease the amount of time that you wait before being seen by our physicians.       _____________________________________________________________  Should you have questions after your visit to [facility], please contact our office at [phone] between the hours of 8:30 a.m. and 4:30 p.m.  Voicemails left after 4:30 p.m. will not be returned until the following business day.  For prescription refill requests, have your pharmacy contact our office.       Resources For Cancer Patients and their Caregivers ? American Cancer Society: Can assist with transportation, wigs, general needs, runs Look Good Feel Better.        1-888-227-6333 ? Cancer Care: Provides financial assistance, online support groups, medication/co-pay assistance.  1-800-813-HOPE (4673) ? [facility] Assists [place] cancer patients and their families through emotional , educational and financial support.  336-427-4357 ? [place] DSS Where to apply for  food stamps, Medicaid and utility assistance. 336-342-1394 ? [facility]: Transportation to medical appointments. 336-347-2287 ? Social Security Administration: May apply for disability if have a Stage IV cancer. 336-342-7796 1-800-772-1213 ? [place] Aging, Disability and Transit Services: Assists with nutrition, care and transit needs. 336-349-2343  Cancer Center Support Programs:   > Cancer Support Group  2nd Tuesday of the month 1pm-2pm, Journey Room   > Creative Journey  3rd Tuesday of the month 1130am-1pm, Journey Room    

## 2017-10-19 NOTE — Progress Notes (Signed)
Diagnosis Chronic iron deficiency anemia - Plan: CBC with Differential/Platelet, Comprehensive metabolic panel, Lactate dehydrogenase, Protein electrophoresis, serum, Ferritin  Staging Cancer Staging No matching staging information was found for the patient.  Assessment and Plan: 1.  Iron deficiency anemia.  42 year old female who reports pica for ice.  She describes her cycles as occasionally heavy with last menstrual period occurring 10/18/2017.    She denies any family history of colon cancer.  She reports she has not been told she has uterine fibroids.  She had labs done October 14, 2017 that showed a white count 5.5 hemoglobin 8.4 platelets 402,000.  Ferritin was 2.  Review of records show Hemoglobin was decreased dating back to 2016.  Patient denies any blood in her stool or her urine.  She is seen today for consultation due to iron deficiency anemia.  Long talk held with the patient today regarding her recent lab results.  I discussed with her ferritin is low at 2 which is consistent with IDA.  She has an intolerance to oral iron and will be recommended for IV Injectafer 750 mg IV day 1 and day 8.  She will return to clinic in 8 weeks for follow-up and repeat labs after IV iron.  Side effects of the medication were reviewed with the patient.  She is also referred to GI and GYN for evaluation.  2.  Pica.  She reports craving ice.  This is likely due to iron deficiency and will assess for improvement in symptoms with IV iron.  3.  Hypertension.  Blood pressure is 161/109.  Follow-up with PCP for management.  HPI: 42 year old female who reports pica for ice.  She describes her cycles as occasionally heavy with last menstrual period occurring 10/18/2017.    She denies any family history of colon cancer.  She reports she has not been told she has uterine fibroids.  She had labs done October 14, 2017 that showed a white count 5.5 hemoglobin 8.4 platelets 402,000.  Ferritin was 2.  Review of records show  Hemoglobin was decreased dating back to 2016.  Patient denies any blood in her stool or her urine.  She is seen today for consultation due to iron deficiency anemia.  Problem List Patient Active Problem List   Diagnosis Date Noted  . Chronic iron deficiency anemia [D50.9] 09/10/2017  . HTN (hypertension), benign [I10] 06/01/2013    Past Medical History Past Medical History:  Diagnosis Date  . Hypertension   . Migraine     Past Surgical History History reviewed. No pertinent surgical history.  Family History Family History  Problem Relation Age of Onset  . Hypertension Mother   . Kidney disease Mother        lupus,HTN  . Hypertension Brother   . Heart disease Maternal Grandmother   . Hyperlipidemia Maternal Grandmother   . Hypertension Maternal Grandmother   . Cancer Maternal Grandfather        colon     Social History  reports that she has never smoked. She has never used smokeless tobacco. She reports that she does not drink alcohol or use drugs.  Medications  Current Outpatient Medications:  .  ALPRAZolam (XANAX) 1 MG tablet, Take 1 tablet (1 mg total) by mouth at bedtime as needed. for sleep, Disp: 30 tablet, Rfl: 0 .  amLODipine (NORVASC) 10 MG tablet, Take 1 tablet (10 mg total) by mouth daily., Disp: 30 tablet, Rfl: 5 .  hydrochlorothiazide (HYDRODIURIL) 25 MG tablet, Take 1 tablet (25 mg  total) by mouth daily., Disp: 30 tablet, Rfl: 5 .  levofloxacin (LEVAQUIN) 500 MG tablet, Take 1 tablet (500 mg total) by mouth daily., Disp: 10 tablet, Rfl: 0 .  lisinopril (PRINIVIL,ZESTRIL) 5 MG tablet, Take 1 tablet (5 mg total) by mouth every morning., Disp: 30 tablet, Rfl: 5 .  predniSONE (DELTASONE) 20 MG tablet, 3 po qd x 3 d then 2 po qd x 3 d then 1 po qd x 2 d, Disp: 17 tablet, Rfl: 0 .  Vitamin D, Ergocalciferol, (DRISDOL) 50000 units CAPS capsule, Take 1 capsule (50,000 Units total) by mouth every 7 (seven) days., Disp: 4 capsule, Rfl:  5  Allergies Penicillins  Review of Systems Review of Systems - Oncology ROS as per HPI otherwise 12 point ROS is negative.   Physical Exam  Vitals Wt Readings from Last 3 Encounters:  10/19/17 184 lb 14.4 oz (83.9 kg)  09/02/17 185 lb (83.9 kg)  06/30/17 175 lb (79.4 kg)   Temp Readings from Last 3 Encounters:  10/19/17 98.6 F (37 C) (Oral)  06/30/17 98.8 F (37.1 C) (Oral)  02/04/17 98.5 F (36.9 C) (Oral)   BP Readings from Last 3 Encounters:  10/19/17 (!) 161/109  09/02/17 138/78  06/30/17 (!) 165/112   Pulse Readings from Last 3 Encounters:  10/19/17 99  06/30/17 88  02/04/17 60   Constitutional: Well-developed, well-nourished, and in no distress.   HENT: Head: Normocephalic and atraumatic.  Mouth/Throat: No oropharyngeal exudate. Mucosa moist. Eyes: Pupils are equal, round, and reactive to light. Conjunctivae are normal. No scleral icterus.  Neck: Normal range of motion. Neck supple. No JVD present.  Cardiovascular: Normal rate, regular rhythm and normal heart sounds.  Exam reveals no gallop and no friction rub.   No murmur heard. Pulmonary/Chest: Effort normal and breath sounds normal. No respiratory distress. No wheezes.No rales.  Abdominal: Soft. Bowel sounds are normal. No distension. There is no tenderness. There is no guarding.  Musculoskeletal: No edema or tenderness.  Lymphadenopathy: No cervical, axillary or supraclavicular adenopathy.  Neurological: Alert and oriented to person, place, and time. No cranial nerve deficit.  Skin: Skin is warm and dry. No rash noted. No erythema. No pallor.  Psychiatric: Affect and judgment normal.   Labs No visits with results within 3 Day(s) from this visit.  Latest known visit with results is:  Appointment on 10/14/2017  Component Date Value Ref Range Status  . WBC 10/14/2017 5.5  4.0 - 10.5 K/uL Final  . RBC 10/14/2017 5.07  3.87 - 5.11 MIL/uL Final  . Hemoglobin 10/14/2017 8.4* 12.0 - 15.0 g/dL Final  .  HCT 40/98/119106/09/2017 29.9* 36.0 - 46.0 % Final  . MCV 10/14/2017 59.0* 78.0 - 100.0 fL Final  . MCH 10/14/2017 16.6* 26.0 - 34.0 pg Final  . MCHC 10/14/2017 28.1* 30.0 - 36.0 g/dL Final  . RDW 47/82/956206/09/2017 22.5* 11.5 - 15.5 % Final  . Platelets 10/14/2017 402* 150 - 400 K/uL Final   Comment: SPECIMEN CHECKED FOR CLOTS PLATELET COUNT CONFIRMED BY SMEAR   . Neutrophils Relative % 10/14/2017 44  % Final  . Neutro Abs 10/14/2017 2.3  1.7 - 7.7 K/uL Final  . Lymphocytes Relative 10/14/2017 42  % Final  . Lymphs Abs 10/14/2017 2.3  0.7 - 4.0 K/uL Final  . Monocytes Relative 10/14/2017 9  % Final  . Monocytes Absolute 10/14/2017 0.5  0.1 - 1.0 K/uL Final  . Eosinophils Relative 10/14/2017 5  % Final  . Eosinophils Absolute 10/14/2017 0.3  0.0 -  0.7 K/uL Final  . Basophils Relative 10/14/2017 0  % Final  . Basophils Absolute 10/14/2017 0.0  0.0 - 0.1 K/uL Final  . RBC Morphology 10/14/2017 ANISOCYTES   Final   Comment: MICROCYTES Schistocytes present Performed at [facility], [address] [place], [place] [address]   . Iron 10/14/2017 16* 28 - 170 ug/dL Final  . TIBC 60/45/4098 424  250 - 450 ug/dL Final  . Saturation Ratios 10/14/2017 4* 10.4 - 31.8 % Final  . UIBC 10/14/2017 408  ug/dL Final   Performed at [facility], [address], [place], [place] [address]  . Ferritin 10/14/2017 2* 11 - 307 ng/mL Final   Performed at [facility], [address], [place], [place] [address]  . Folate 10/14/2017 7.6  >5.9 ng/mL Final   Performed at [facility], [address], [place], [place] [address]     Pathology Orders Placed This Encounter  Procedures  . CBC with Differential/Platelet    Standing Status:   Future    Standing Expiration Date:   10/20/2018  . Comprehensive metabolic panel    Standing Status:   Future    Standing Expiration Date:   10/20/2018  . Lactate dehydrogenase    Standing Status:   Future    Standing Expiration Date:   10/20/2018  . Protein  electrophoresis, serum    Standing Status:   Future    Standing Expiration Date:   10/20/2018  . Ferritin    Standing Status:   Future    Standing Expiration Date:   10/20/2018       Ahmed Prima MD

## 2017-10-26 ENCOUNTER — Encounter (HOSPITAL_COMMUNITY): Payer: Self-pay

## 2017-10-26 ENCOUNTER — Inpatient Hospital Stay (HOSPITAL_COMMUNITY): Payer: BC Managed Care – PPO

## 2017-10-26 ENCOUNTER — Other Ambulatory Visit: Payer: Self-pay

## 2017-10-26 VITALS — BP 147/100 | HR 91 | Temp 98.7°F | Resp 18

## 2017-10-26 DIAGNOSIS — D509 Iron deficiency anemia, unspecified: Secondary | ICD-10-CM

## 2017-10-26 MED ORDER — SODIUM CHLORIDE 0.9 % IV SOLN
750.0000 mg | Freq: Once | INTRAVENOUS | Status: AC
  Administered 2017-10-26: 750 mg via INTRAVENOUS
  Filled 2017-10-26: qty 15

## 2017-10-26 MED ORDER — SODIUM CHLORIDE 0.9 % IV SOLN
INTRAVENOUS | Status: DC
  Administered 2017-10-26: 14:00:00 via INTRAVENOUS

## 2017-10-26 NOTE — Progress Notes (Signed)
Injectafer given today.  Patient tolerated it well without problems. Vitals stable and discharged home from clinic ambulatory. Follow up as scheduled.

## 2017-10-27 NOTE — Patient Instructions (Signed)
[facility] at [facility] Discharge Instructions  Injectafer given today Follow up as scheduled.   Thank you for choosing [facility] at [facility] to provide your oncology and hematology care.  To afford each patient quality time with our provider, please arrive at least 15 minutes before your scheduled appointment time.   If you have a lab appointment with the Cancer Center please come in thru the  Main Entrance and check in at the main information desk  You need to re-schedule your appointment should you arrive 10 or more minutes late.  We strive to give you quality time with our providers, and arriving late affects you and other patients whose appointments are after yours.  Also, if you no show three or more times for appointments you may be dismissed from the clinic at the providers discretion.     Again, thank you for choosing [facility].  Our hope is that these requests will decrease the amount of time that you wait before being seen by our physicians.       _____________________________________________________________  Should you have questions after your visit to [facility], please contact our office at [phone] between the hours of 8:30 a.m. and 4:30 p.m.  Voicemails left after 4:30 p.m. will not be returned until the following business day.  For prescription refill requests, have your pharmacy contact our office.       Resources For Cancer Patients and their Caregivers ? American Cancer Society: Can assist with transportation, wigs, general needs, runs Look Good Feel Better.        1-888-227-6333 ? Cancer Care: Provides financial assistance, online support groups, medication/co-pay assistance.  1-800-813-HOPE (4673) ? [facility] Assists [place] cancer patients and their families through emotional , educational and financial support.  336-427-4357 ? [place]  DSS Where to apply for food stamps, Medicaid and utility assistance. 336-342-1394 ? [facility]: Transportation to medical appointments. 336-347-2287 ? Social Security Administration: May apply for disability if have a Stage IV cancer. 336-342-7796 1-800-772-1213 ? [place] Aging, Disability and Transit Services: Assists with nutrition, care and transit needs. 336-349-2343  Cancer Center Support Programs:   > Cancer Support Group  2nd Tuesday of the month 1pm-2pm, Journey Room   > Creative Journey  3rd Tuesday of the month 1130am-1pm, Journey Room    

## 2017-11-02 ENCOUNTER — Encounter (HOSPITAL_COMMUNITY): Payer: Self-pay

## 2017-11-02 ENCOUNTER — Inpatient Hospital Stay (HOSPITAL_COMMUNITY): Payer: BC Managed Care – PPO

## 2017-11-02 ENCOUNTER — Other Ambulatory Visit: Payer: Self-pay

## 2017-11-02 VITALS — BP 147/102 | HR 76 | Temp 98.0°F | Resp 18

## 2017-11-02 DIAGNOSIS — D509 Iron deficiency anemia, unspecified: Secondary | ICD-10-CM

## 2017-11-02 MED ORDER — SODIUM CHLORIDE 0.9 % IV SOLN
750.0000 mg | Freq: Once | INTRAVENOUS | Status: AC
  Administered 2017-11-02: 750 mg via INTRAVENOUS
  Filled 2017-11-02: qty 15

## 2017-11-02 MED ORDER — SODIUM CHLORIDE 0.9 % IV SOLN
INTRAVENOUS | Status: DC
  Administered 2017-11-02: 15:00:00 via INTRAVENOUS

## 2017-11-02 NOTE — Progress Notes (Signed)
Tolerated infusion w/o adverse reaction.  Alert, in no distress.  VSS.  Discharged ambulatory in c/o family.  

## 2017-12-07 ENCOUNTER — Inpatient Hospital Stay (HOSPITAL_COMMUNITY): Payer: BC Managed Care – PPO | Attending: Anesthesiology

## 2017-12-07 DIAGNOSIS — D509 Iron deficiency anemia, unspecified: Secondary | ICD-10-CM | POA: Diagnosis present

## 2017-12-07 LAB — COMPREHENSIVE METABOLIC PANEL
ALT: 10 U/L (ref 0–44)
AST: 13 U/L — ABNORMAL LOW (ref 15–41)
Albumin: 4 g/dL (ref 3.5–5.0)
Alkaline Phosphatase: 44 U/L (ref 38–126)
Anion gap: 6 (ref 5–15)
BUN: 8 mg/dL (ref 6–20)
CO2: 24 mmol/L (ref 22–32)
CREATININE: 0.68 mg/dL (ref 0.44–1.00)
Calcium: 8.6 mg/dL — ABNORMAL LOW (ref 8.9–10.3)
Chloride: 108 mmol/L (ref 98–111)
Glucose, Bld: 89 mg/dL (ref 70–99)
POTASSIUM: 3.8 mmol/L (ref 3.5–5.1)
Sodium: 138 mmol/L (ref 135–145)
Total Bilirubin: 0.6 mg/dL (ref 0.3–1.2)
Total Protein: 7.7 g/dL (ref 6.5–8.1)

## 2017-12-07 LAB — CBC WITH DIFFERENTIAL/PLATELET
Basophils Absolute: 0 10*3/uL (ref 0.0–0.1)
Basophils Relative: 0 %
EOS ABS: 0.3 10*3/uL (ref 0.0–0.7)
Eosinophils Relative: 6 %
HCT: 44.8 % (ref 36.0–46.0)
Hemoglobin: 13.8 g/dL (ref 12.0–15.0)
Lymphocytes Relative: 36 %
Lymphs Abs: 1.6 10*3/uL (ref 0.7–4.0)
MCH: 22.7 pg — AB (ref 26.0–34.0)
MCHC: 30.8 g/dL (ref 30.0–36.0)
MCV: 73.7 fL — AB (ref 78.0–100.0)
MONOS PCT: 8 %
Monocytes Absolute: 0.4 10*3/uL (ref 0.1–1.0)
NEUTROS ABS: 2.1 10*3/uL (ref 1.7–7.7)
NEUTROS PCT: 50 %
PLATELETS: 247 10*3/uL (ref 150–400)
RBC: 6.08 MIL/uL — ABNORMAL HIGH (ref 3.87–5.11)
RDW: 26.4 % — AB (ref 11.5–15.5)
WBC: 4.3 10*3/uL (ref 4.0–10.5)

## 2017-12-07 LAB — LACTATE DEHYDROGENASE: LDH: 150 U/L (ref 98–192)

## 2017-12-07 LAB — FERRITIN: FERRITIN: 25 ng/mL (ref 11–307)

## 2017-12-08 LAB — PROTEIN ELECTROPHORESIS, SERUM
A/G RATIO SPE: 0.9 (ref 0.7–1.7)
Albumin ELP: 3.4 g/dL (ref 2.9–4.4)
Alpha-1-Globulin: 0.3 g/dL (ref 0.0–0.4)
Alpha-2-Globulin: 0.7 g/dL (ref 0.4–1.0)
Beta Globulin: 1.2 g/dL (ref 0.7–1.3)
Gamma Globulin: 1.7 g/dL (ref 0.4–1.8)
Globulin, Total: 3.9 g/dL (ref 2.2–3.9)
Total Protein ELP: 7.3 g/dL (ref 6.0–8.5)

## 2017-12-14 ENCOUNTER — Inpatient Hospital Stay (HOSPITAL_COMMUNITY): Payer: BC Managed Care – PPO | Attending: Anesthesiology | Admitting: Internal Medicine

## 2017-12-14 ENCOUNTER — Ambulatory Visit (HOSPITAL_COMMUNITY): Payer: BC Managed Care – PPO | Admitting: Internal Medicine

## 2017-12-14 VITALS — BP 159/106 | HR 78 | Temp 98.0°F | Resp 18 | Ht 64.0 in | Wt 190.0 lb

## 2017-12-14 DIAGNOSIS — D508 Other iron deficiency anemias: Secondary | ICD-10-CM

## 2017-12-14 DIAGNOSIS — I10 Essential (primary) hypertension: Secondary | ICD-10-CM | POA: Insufficient documentation

## 2017-12-14 DIAGNOSIS — N920 Excessive and frequent menstruation with regular cycle: Secondary | ICD-10-CM | POA: Diagnosis present

## 2017-12-14 DIAGNOSIS — D509 Iron deficiency anemia, unspecified: Secondary | ICD-10-CM

## 2017-12-14 NOTE — Progress Notes (Signed)
Diagnosis Chronic iron deficiency anemia - Plan: CBC with Differential/Platelet, Comprehensive metabolic panel, Lactate dehydrogenase, Ferritin  Staging Cancer Staging No matching staging information was found for the patient.  Assessment and Plan:  1.  Iron deficiency anemia.  Pt was last treated with IV iron on 11/02/2017.  Labs done 12/07/2017 reviewed with pt and showed WBC 4.3 HB 13.8 Plts 247,000.  Ferritin 25 which is improved.  HB is improved from 8.4. Pt is referred to GI and GYN for evaluation.  She will be seen for follow-up in 04/2018 with labs.   2.  Pica.  She reports this has improved after IV iron.    3.  Menorrhagia.  Pt is referred to GYN for evaluation.     4.  Hypertension.  Blood pressure is 159/106.  Follow-up with PCP for management.  Interval history:  Historical data obtained from the note dated 10/19/2017.  42 year old female who reports pica for ice.  She describes her cycles as occasionally heavy with last menstrual period occurring 10/18/2017.    She denies any family history of colon cancer.  She reports she has not been told she has uterine fibroids.  She had labs done October 14, 2017 that showed a white count 5.5 hemoglobin 8.4 platelets 402,000.  Ferritin was 2.  Review of records show Hemoglobin was decreased dating back to 2016.  Patient denies any blood in her stool or her urine.  She is seen today for consultation due to iron deficiency anemia.  Current Status:  Pt is seen today for follow-up.  She is here to go over labs.     Problem List Patient Active Problem List   Diagnosis Date Noted  . Chronic iron deficiency anemia [D50.9] 09/10/2017  . HTN (hypertension), benign [I10] 06/01/2013    Past Medical History Past Medical History:  Diagnosis Date  . Hypertension   . Migraine     Past Surgical History No past surgical history on file.  Family History Family History  Problem Relation Age of Onset  . Hypertension Mother   . Kidney disease  Mother        lupus,HTN  . Hypertension Brother   . Heart disease Maternal Grandmother   . Hyperlipidemia Maternal Grandmother   . Hypertension Maternal Grandmother   . Cancer Maternal Grandfather        colon     Social History  reports that she has never smoked. She has never used smokeless tobacco. She reports that she does not drink alcohol or use drugs.  Medications  Current Outpatient Medications:  .  ALPRAZolam (XANAX) 1 MG tablet, Take 1 tablet (1 mg total) by mouth at bedtime as needed. for sleep, Disp: 30 tablet, Rfl: 0 .  amLODipine (NORVASC) 10 MG tablet, Take 1 tablet (10 mg total) by mouth daily., Disp: 30 tablet, Rfl: 5 .  hydrochlorothiazide (HYDRODIURIL) 25 MG tablet, Take 1 tablet (25 mg total) by mouth daily., Disp: 30 tablet, Rfl: 5 .  lisinopril (PRINIVIL,ZESTRIL) 5 MG tablet, Take 1 tablet (5 mg total) by mouth every morning., Disp: 30 tablet, Rfl: 5 .  predniSONE (DELTASONE) 20 MG tablet, 3 po qd x 3 d then 2 po qd x 3 d then 1 po qd x 2 d, Disp: 17 tablet, Rfl: 0 .  Vitamin D, Ergocalciferol, (DRISDOL) 50000 units CAPS capsule, Take 1 capsule (50,000 Units total) by mouth every 7 (seven) days., Disp: 4 capsule, Rfl: 5  Allergies Penicillins  Review of Systems Review of Systems -  Oncology ROS negative.     Physical Exam  Vitals Wt Readings from Last 3 Encounters:  12/14/17 190 lb (86.2 kg)  10/19/17 184 lb 14.4 oz (83.9 kg)  09/02/17 185 lb (83.9 kg)   Temp Readings from Last 3 Encounters:  12/14/17 98 F (36.7 C) (Oral)  11/02/17 98 F (36.7 C) (Oral)  10/26/17 98.7 F (37.1 C) (Oral)   BP Readings from Last 3 Encounters:  12/14/17 (!) 159/106  11/02/17 (!) 147/102  10/26/17 (!) 147/100   Pulse Readings from Last 3 Encounters:  12/14/17 78  11/02/17 76  10/26/17 91   Constitutional: Well-developed, well-nourished, and in no distress.   HENT: Head: Normocephalic and atraumatic.  Mouth/Throat: No oropharyngeal exudate. Mucosa  moist. Eyes: Pupils are equal, round, and reactive to light. Conjunctivae are normal. No scleral icterus.  Neck: Normal range of motion. Neck supple. No JVD present.  Cardiovascular: Normal rate, regular rhythm and normal heart sounds.  Exam reveals no gallop and no friction rub.   No murmur heard. Pulmonary/Chest: Effort normal and breath sounds normal. No respiratory distress. No wheezes.No rales.  Abdominal: Soft. Bowel sounds are normal. No distension. There is no tenderness. There is no guarding.  Musculoskeletal: No edema or tenderness.  Lymphadenopathy: No cervical, axillary or supraclavicular adenopathy.  Neurological: Alert and oriented to person, place, and time. No cranial nerve deficit.  Skin: Skin is warm and dry. No rash noted. No erythema. No pallor.  Psychiatric: Affect and judgment normal.   Labs No visits with results within 3 Day(s) from this visit.  Latest known visit with results is:  Appointment on 12/07/2017  Component Date Value Ref Range Status  . WBC 12/07/2017 4.3  4.0 - 10.5 K/uL Final  . RBC 12/07/2017 6.08* 3.87 - 5.11 MIL/uL Final  . Hemoglobin 12/07/2017 13.8  12.0 - 15.0 g/dL Final  . HCT 12/07/2017 44.8  36.0 - 46.0 % Final  . MCV 12/07/2017 73.7* 78.0 - 100.0 fL Final  . MCH 12/07/2017 22.7* 26.0 - 34.0 pg Final  . MCHC 12/07/2017 30.8  30.0 - 36.0 g/dL Final  . RDW 12/07/2017 26.4* 11.5 - 15.5 % Final  . Platelets 12/07/2017 247  150 - 400 K/uL Final   Comment: PLATELET COUNT CONFIRMED BY SMEAR SPECIMEN CHECKED FOR CLOTS   . Neutrophils Relative % 12/07/2017 50  % Final  . Neutro Abs 12/07/2017 2.1  1.7 - 7.7 K/uL Final  . Lymphocytes Relative 12/07/2017 36  % Final  . Lymphs Abs 12/07/2017 1.6  0.7 - 4.0 K/uL Final  . Monocytes Relative 12/07/2017 8  % Final  . Monocytes Absolute 12/07/2017 0.4  0.1 - 1.0 K/uL Final  . Eosinophils Relative 12/07/2017 6  % Final  . Eosinophils Absolute 12/07/2017 0.3  0.0 - 0.7 K/uL Final  . Basophils Relative  12/07/2017 0  % Final  . Basophils Absolute 12/07/2017 0.0  0.0 - 0.1 K/uL Final  . RBC Morphology 12/07/2017 ANISOCYTES   Final   Comment: FEW FRAGMENTED RED CELLS Performed at [facility], [address], [place], [place] [address]   . Sodium 12/07/2017 138  135 - 145 mmol/L Final  . Potassium 12/07/2017 3.8  3.5 - 5.1 mmol/L Final  . Chloride 12/07/2017 108  98 - 111 mmol/L Final  . CO2 12/07/2017 24  22 - 32 mmol/L Final  . Glucose, Bld 12/07/2017 89  70 - 99 mg/dL Final  . BUN 12/07/2017 8  6 - 20 mg/dL Final  . Creatinine, Ser  12/07/2017 0.68  0.44 - 1.00 mg/dL Final  . Calcium 12/07/2017 8.6* 8.9 - 10.3 mg/dL Final  . Total Protein 12/07/2017 7.7  6.5 - 8.1 g/dL Final  . Albumin 12/07/2017 4.0  3.5 - 5.0 g/dL Final  . AST 12/07/2017 13* 15 - 41 U/L Final  . ALT 12/07/2017 10  0 - 44 U/L Final  . Alkaline Phosphatase 12/07/2017 44  38 - 126 U/L Final  . Total Bilirubin 12/07/2017 0.6  0.3 - 1.2 mg/dL Final  . GFR calc non Af Amer 12/07/2017 >60  >60 mL/min Final  . GFR calc Af Amer 12/07/2017 >60  >60 mL/min Final   Comment: (NOTE) The eGFR has been calculated using the CKD EPI equation. This calculation has not been validated in all clinical situations. eGFR's persistently <60 mL/min signify possible Chronic Kidney Disease.   Georgiann Hahn gap 12/07/2017 6  5 - 15 Final   Performed at [facility], [address], [place], [place] [address]  . LDH 12/07/2017 150  98 - 192 U/L Final   Performed at [facility], [address], [place], [place] [address]  . Total Protein ELP 12/07/2017 7.3  6.0 - 8.5 g/dL Final  . Albumin ELP 12/07/2017 3.4  2.9 - 4.4 g/dL Final  . Alpha-1-Globulin 12/07/2017 0.3  0.0 - 0.4 g/dL Final  . Alpha-2-Globulin 12/07/2017 0.7  0.4 - 1.0 g/dL Final  . Beta Globulin 12/07/2017 1.2  0.7 - 1.3 g/dL Final  . Gamma Globulin 12/07/2017 1.7  0.4 - 1.8 g/dL Final  . M-Spike, % 12/07/2017 Not Observed  Not Observed g/dL Final  . SPE Interp. 12/07/2017 Comment    Final   Comment: (NOTE) The SPE pattern appears essentially unremarkable. Evidence of monoclonal protein is not apparent. Performed At: [facility] [place], [place] [id number] Rush Farmer MD RF:[id number]   . Comment 12/07/2017 Comment   Final   Comment: (NOTE) Protein electrophoresis scan will follow via computer, mail, or courier delivery.   Marland Kitchen GLOBULIN, TOTAL 12/07/2017 3.9  2.2 - 3.9 g/dL Corrected  . A/G Ratio 12/07/2017 0.9  0.7 - 1.7 Corrected  . Ferritin 12/07/2017 25  11 - 307 ng/mL Final   Performed at [facility], [place] [address], [place], [place] [address]     Pathology Orders Placed This Encounter  Procedures  . CBC with Differential/Platelet    Standing Status:   Future    Standing Expiration Date:   12/15/2018  . Comprehensive metabolic panel    Standing Status:   Future    Standing Expiration Date:   12/15/2018  . Lactate dehydrogenase    Standing Status:   Future    Standing Expiration Date:   12/15/2018  . Ferritin    Standing Status:   Future    Standing Expiration Date:   12/15/2018       Zoila Shutter MD

## 2017-12-17 ENCOUNTER — Encounter: Payer: Self-pay | Admitting: Advanced Practice Midwife

## 2017-12-17 ENCOUNTER — Ambulatory Visit (INDEPENDENT_AMBULATORY_CARE_PROVIDER_SITE_OTHER): Payer: BC Managed Care – PPO | Admitting: Advanced Practice Midwife

## 2017-12-17 VITALS — BP 155/112 | HR 100 | Ht 64.0 in | Wt 189.0 lb

## 2017-12-17 DIAGNOSIS — N920 Excessive and frequent menstruation with regular cycle: Secondary | ICD-10-CM

## 2017-12-17 MED ORDER — NAPROXEN 500 MG PO TBEC: 500.0000 mg | DELAYED_RELEASE_TABLET | Freq: Two times a day (BID) | ORAL | 2 refills | Status: DC

## 2017-12-17 NOTE — Progress Notes (Signed)
[facility] Visit  Patient name: April Finley MRN [id number]  Date of birth: 06/24/1975  CC & HPI:  April Finley is a 42 y.o. African American female presenting today for menorrhagia/crampt\s. Always cramps before, during and after period, but the last few periods have been esp crampy. Has bled so much that she has become anemic and received iV iron.  Disucssed medical vs surgical mgt. Will try medical for now .CHooses COC   Pertinent History Reviewed:  Medical & Surgical Hx:   Past Medical History:  Diagnosis Date  . Hypertension   . Migraine    History reviewed. No pertinent surgical history. Family History  Problem Relation Age of Onset  . Hypertension Mother   . Kidney disease Mother        lupus,HTN  . Hypertension Brother   . Heart disease Maternal Grandmother   . Hyperlipidemia Maternal Grandmother   . Hypertension Maternal Grandmother   . Cancer Maternal Grandfather        colon    Current Outpatient Medications:  .  ALPRAZolam (XANAX) 1 MG tablet, Take 1 tablet (1 mg total) by mouth at bedtime as needed. for sleep, Disp: 30 tablet, Rfl: 0 .  amLODipine (NORVASC) 10 MG tablet, Take 1 tablet (10 mg total) by mouth daily., Disp: 30 tablet, Rfl: 5 .  hydrochlorothiazide (HYDRODIURIL) 25 MG tablet, Take 1 tablet (25 mg total) by mouth daily., Disp: 30 tablet, Rfl: 5 .  lisinopril (PRINIVIL,ZESTRIL) 5 MG tablet, Take 1 tablet (5 mg total) by mouth every morning., Disp: 30 tablet, Rfl: 5 .  Vitamin D, Ergocalciferol, (DRISDOL) 50000 units CAPS capsule, Take 1 capsule (50,000 Units total) by mouth every 7 (seven) days., Disp: 4 capsule, Rfl: 5 .  predniSONE (DELTASONE) 20 MG tablet, 3 po qd x 3 d then 2 po qd x 3 d then 1 po qd x 2 d (Patient not taking: Reported on 12/17/2017), Disp: 17 tablet, Rfl: 0 Social History: Reviewed -  reports that she has never smoked. She has never used smokeless tobacco.  Review of Systems:   Constitutional: Negative for fever and  chills Eyes: Negative for visual disturbances Respiratory: Negative for shortness of breath, dyspnea Cardiovascular: Negative for chest pain or palpitations  Gastrointestinal: Negative for vomiting, diarrhea and constipation; no abdominal pain Genitourinary: Negative for dysuria and urgency, vaginal irritation or itching Musculoskeletal: Negative for back pain, joint pain, myalgias  Neurological: Negative for dizziness and headaches    Objective Findings:    Physical Examination: Vitals:   12/17/17 1334  BP: (!) 155/112  Pulse: 100   General appearance - well appearing, and in no distress Mental status - alert, oriented to person, place, and time Chest:  Normal respiratory effort Heart - normal rate and regular rhythm Abdomen:  Soft, nontender Pelvic: deferred Musculoskeletal:  Normal range of motion without pain Extremities:  No edema    No results found for this or any previous visit (from the past 24 hour(s)).    Assessment & Plan:  A:   Start LoLoestrin now (2 samples given).  Let me know how it's going in 2 months.  Consider pelvic US if prefers surgical mgt or COCs not helping  Jacklyn ShellFrances Cresenzo-Dishmon CNM 12/17/2017 2:19 PM

## 2017-12-21 ENCOUNTER — Encounter: Payer: Self-pay | Admitting: Gastroenterology

## 2017-12-29 ENCOUNTER — Encounter: Payer: BC Managed Care – PPO | Admitting: Adult Health

## 2018-02-10 ENCOUNTER — Encounter: Payer: Self-pay | Admitting: Advanced Practice Midwife

## 2018-02-10 DIAGNOSIS — N920 Excessive and frequent menstruation with regular cycle: Secondary | ICD-10-CM | POA: Insufficient documentation

## 2018-02-10 NOTE — Progress Notes (Signed)
loloestrin samples left for pt for menorrhagia

## 2018-03-04 ENCOUNTER — Ambulatory Visit: Payer: BC Managed Care – PPO | Admitting: Nurse Practitioner

## 2018-03-26 ENCOUNTER — Encounter: Payer: Self-pay | Admitting: Gastroenterology

## 2018-03-26 ENCOUNTER — Telehealth: Payer: Self-pay | Admitting: Gastroenterology

## 2018-03-26 ENCOUNTER — Ambulatory Visit: Payer: BC Managed Care – PPO | Admitting: Gastroenterology

## 2018-03-26 NOTE — Telephone Encounter (Signed)
PATIENT WAS A NO SHOW AND LETTER SENT  °

## 2018-03-29 ENCOUNTER — Telehealth: Payer: Self-pay | Admitting: Family Medicine

## 2018-03-29 MED ORDER — CLINDAMYCIN HCL 300 MG PO CAPS: ORAL_CAPSULE | ORAL | 0 refills | Status: DC

## 2018-03-29 NOTE — Telephone Encounter (Signed)
Patient is aware 

## 2018-03-29 NOTE — Telephone Encounter (Signed)
Patient is requesting antibiotic for bad tooth has appointment Thursday with dentist. She uses [facility]

## 2018-03-29 NOTE — Telephone Encounter (Signed)
Clindamycin 300 mg 1 pill 3 times daily, #21, definitely go see dentist as planned If high fevers or other problems follow-up Please let the patient know that this antibiotic is not a penicillin

## 2018-03-29 NOTE — Telephone Encounter (Signed)
Ispoke with the pt and she states she has had a abcessed tooth the last 2 days and she has been taking tylenol and oral gel and it is not helping much. She is not running a temp. She has an appt with the Dentist on Thursday,but wanted to know if we could send in an antibx with out being seen. Please advise.

## 2018-04-20 ENCOUNTER — Inpatient Hospital Stay (HOSPITAL_COMMUNITY): Payer: BC Managed Care – PPO | Attending: Hematology

## 2018-04-20 DIAGNOSIS — D500 Iron deficiency anemia secondary to blood loss (chronic): Secondary | ICD-10-CM | POA: Insufficient documentation

## 2018-04-20 DIAGNOSIS — I10 Essential (primary) hypertension: Secondary | ICD-10-CM | POA: Diagnosis not present

## 2018-04-20 DIAGNOSIS — D509 Iron deficiency anemia, unspecified: Secondary | ICD-10-CM

## 2018-04-20 DIAGNOSIS — N920 Excessive and frequent menstruation with regular cycle: Secondary | ICD-10-CM | POA: Diagnosis not present

## 2018-04-20 DIAGNOSIS — Z79899 Other long term (current) drug therapy: Secondary | ICD-10-CM | POA: Diagnosis not present

## 2018-04-20 LAB — COMPREHENSIVE METABOLIC PANEL
ALT: 13 U/L (ref 0–44)
ANION GAP: 7 (ref 5–15)
AST: 13 U/L — ABNORMAL LOW (ref 15–41)
Albumin: 3.8 g/dL (ref 3.5–5.0)
Alkaline Phosphatase: 40 U/L (ref 38–126)
BILIRUBIN TOTAL: 0.5 mg/dL (ref 0.3–1.2)
BUN: 10 mg/dL (ref 6–20)
CALCIUM: 8.7 mg/dL — AB (ref 8.9–10.3)
CO2: 25 mmol/L (ref 22–32)
CREATININE: 0.88 mg/dL (ref 0.44–1.00)
Chloride: 106 mmol/L (ref 98–111)
Glucose, Bld: 82 mg/dL (ref 70–99)
Potassium: 4.1 mmol/L (ref 3.5–5.1)
Sodium: 138 mmol/L (ref 135–145)
TOTAL PROTEIN: 8.1 g/dL (ref 6.5–8.1)

## 2018-04-20 LAB — CBC WITH DIFFERENTIAL/PLATELET
Abs Immature Granulocytes: 0.01 10*3/uL (ref 0.00–0.07)
Basophils Absolute: 0 10*3/uL (ref 0.0–0.1)
Basophils Relative: 1 %
EOS PCT: 6 %
Eosinophils Absolute: 0.3 10*3/uL (ref 0.0–0.5)
HEMATOCRIT: 45 % (ref 36.0–46.0)
Hemoglobin: 13.4 g/dL (ref 12.0–15.0)
Immature Granulocytes: 0 %
LYMPHS ABS: 2.3 10*3/uL (ref 0.7–4.0)
LYMPHS PCT: 45 %
MCH: 23.2 pg — ABNORMAL LOW (ref 26.0–34.0)
MCHC: 29.8 g/dL — AB (ref 30.0–36.0)
MCV: 77.9 fL — AB (ref 80.0–100.0)
MONO ABS: 0.4 10*3/uL (ref 0.1–1.0)
MONOS PCT: 8 %
NEUTROS ABS: 2 10*3/uL (ref 1.7–7.7)
Neutrophils Relative %: 40 %
Platelets: 305 10*3/uL (ref 150–400)
RBC: 5.78 MIL/uL — AB (ref 3.87–5.11)
RDW: 17 % — ABNORMAL HIGH (ref 11.5–15.5)
WBC: 5 10*3/uL (ref 4.0–10.5)
nRBC: 0 % (ref 0.0–0.2)

## 2018-04-20 LAB — FERRITIN: FERRITIN: 7 ng/mL — AB (ref 11–307)

## 2018-04-20 LAB — LACTATE DEHYDROGENASE: LDH: 137 U/L (ref 98–192)

## 2018-04-27 ENCOUNTER — Encounter (HOSPITAL_COMMUNITY): Payer: Self-pay | Admitting: Hematology

## 2018-04-27 ENCOUNTER — Inpatient Hospital Stay (HOSPITAL_BASED_OUTPATIENT_CLINIC_OR_DEPARTMENT_OTHER): Payer: BC Managed Care – PPO | Admitting: Hematology

## 2018-04-27 ENCOUNTER — Other Ambulatory Visit: Payer: Self-pay

## 2018-04-27 VITALS — BP 154/89 | HR 83 | Temp 97.2°F | Resp 16

## 2018-04-27 DIAGNOSIS — Z79899 Other long term (current) drug therapy: Secondary | ICD-10-CM | POA: Diagnosis not present

## 2018-04-27 DIAGNOSIS — N920 Excessive and frequent menstruation with regular cycle: Secondary | ICD-10-CM | POA: Diagnosis not present

## 2018-04-27 DIAGNOSIS — D509 Iron deficiency anemia, unspecified: Secondary | ICD-10-CM

## 2018-04-27 DIAGNOSIS — D500 Iron deficiency anemia secondary to blood loss (chronic): Secondary | ICD-10-CM

## 2018-04-27 DIAGNOSIS — I10 Essential (primary) hypertension: Secondary | ICD-10-CM | POA: Diagnosis not present

## 2018-04-27 NOTE — Patient Instructions (Signed)
[facility] Discharge Instructions  Follow up in    Thank you for choosing [facility] at [facility] to provide your oncology and hematology care.  To afford each patient quality time with our provider, please arrive at least 15 minutes before your scheduled appointment time.   If you have a lab appointment with the Cancer Center please come in thru the  Main Entrance and check in at the main information desk  You need to re-schedule your appointment should you arrive 10 or more minutes late.  We strive to give you quality time with our providers, and arriving late affects you and other patients whose appointments are after yours.  Also, if you no show three or more times for appointments you may be dismissed from the clinic at the providers discretion.     Again, thank you for choosing [facility].  Our hope is that these requests will decrease the amount of time that you wait before being seen by our physicians.       _____________________________________________________________  Should you have questions after your visit to [facility], please contact our office at [phone] between the hours of 8:00 a.m. and 4:30 p.m.  Voicemails left after 4:00 p.m. will not be returned until the following business day.  For prescription refill requests, have your pharmacy contact our office and allow 72 hours.    Cancer Center Support Programs:   > Cancer Support Group  2nd Tuesday of the month 1pm-2pm, Journey Room

## 2018-04-27 NOTE — Assessment & Plan Note (Signed)
1.  Iron deficiency anemia: - She has menorrhagia with 2 days of heavy bleeding.  (5-7 days/q28). - She was reportedly started on oral contraceptive pills 2 months ago and she has noticed decrease in bleeding.  She ran out of them at this time. -Received Injectafer on 10/26/2017 and 11/02/2017.  She felt better for a few weeks after receiving infusion. -Lately she has been feeling very weak. -We reviewed her blood work today.  Hemoglobin is 13.4.  However ferritin has decreased to 7 from 25 at last visit. -Hands have recommended 2 infusions of Feraheme weekly x2.  We talked about side effects including serious allergic reactions. - I have also recommended her to start taking 1 iron tablet daily.  She has been off of iron for the last 1 year.  She did not have any side effects from it.  At that time it was thought to be a not helping her anemia.

## 2018-04-27 NOTE — Progress Notes (Signed)
[facility] [address], [place] [address]   CLINIC:  Medical Oncology/Hematology  PCP:  Babs Sciara, MD [address] [place] [address] [phone]   REASON FOR VISIT: Follow-up for Chronic iron deficiency anemia  CURRENT THERAPY: intermittent iron infusions and Birth control pills.   INTERVAL HISTORY:  April Finley 42 y.o. female returns for routine follow-up for iron deficiency anemia. She was referred to GYN and they prescribed birthcontrol to help control her heavy  Bleeding with her periods and it has helped a little but she hasn't been taking them long. She is still fatigued throughout the day. She denies any hemachexia or hematuria. Denies any easy bruising. Denies any headaches. Denies any nausea. Denies fevers or recent infections. She reports her appetite at 50% and her energy level at 50%.     REVIEW OF SYSTEMS:  Review of Systems  Psychiatric/Behavioral: Positive for sleep disturbance.  All other systems reviewed and are negative.    PAST MEDICAL/SURGICAL HISTORY:  Past Medical History:  Diagnosis Date  . Hypertension   . Migraine    History reviewed. No pertinent surgical history.   SOCIAL HISTORY:  Social History   Socioeconomic History  . Marital status: Divorced    Spouse name: Not on file  . Number of children: Not on file  . Years of education: Not on file  . Highest education level: Not on file  Occupational History  . Not on file  Social Needs  . Financial resource strain: Not on file  . Food insecurity:    Worry: Not on file    Inability: Not on file  . Transportation needs:    Medical: Not on file    Non-medical: Not on file  Tobacco Use  . Smoking status: Never Smoker  . Smokeless tobacco: Never Used  Substance and Sexual Activity  . Alcohol use: No  . Drug use: No  . Sexual activity: Not Currently  Lifestyle  . Physical activity:    Days per week: Not on file    Minutes per session:  Not on file  . Stress: Not on file  Relationships  . Social connections:    Talks on phone: Not on file    Gets together: Not on file    Attends religious service: Not on file    Active member of club or organization: Not on file    Attends meetings of clubs or organizations: Not on file    Relationship status: Not on file  . Intimate partner violence:    Fear of current or ex partner: Not on file    Emotionally abused: Not on file    Physically abused: Not on file    Forced sexual activity: Not on file  Other Topics Concern  . Not on file  Social History Narrative  . Not on file    FAMILY HISTORY:  Family History  Problem Relation Age of Onset  . Hypertension Mother   . Kidney disease Mother        lupus,HTN  . Hypertension Brother   . Heart disease Maternal Grandmother   . Hyperlipidemia Maternal Grandmother   . Hypertension Maternal Grandmother   . Cancer Maternal Grandfather        colon    CURRENT MEDICATIONS:  Outpatient Encounter Medications as of 04/27/2018  Medication Sig  . ALPRAZolam (XANAX) 1 MG tablet Take 1 tablet (1 mg total) by mouth at bedtime as needed. for sleep  .  amLODipine (NORVASC) 10 MG tablet Take 1 tablet (10 mg total) by mouth daily.  . hydrochlorothiazide (HYDRODIURIL) 25 MG tablet Take 1 tablet (25 mg total) by mouth daily.  Marland Kitchen lisinopril (PRINIVIL,ZESTRIL) 5 MG tablet Take 1 tablet (5 mg total) by mouth every morning.  . naproxen (EC NAPROSYN) 500 MG EC tablet Take 1 tablet (500 mg total) by mouth 2 (two) times daily with a meal. q 12 hours prn cramping  . [DISCONTINUED] clindamycin (CLEOCIN) 300 MG capsule Take one po TID  . [DISCONTINUED] predniSONE (DELTASONE) 20 MG tablet 3 po qd x 3 d then 2 po qd x 3 d then 1 po qd x 2 d (Patient not taking: Reported on 12/17/2017)  . [DISCONTINUED] Vitamin D, Ergocalciferol, (DRISDOL) 50000 units CAPS capsule Take 1 capsule (50,000 Units total) by mouth every 7 (seven) days.   No facility-administered  encounter medications on file as of 04/27/2018.     ALLERGIES:  Allergies  Allergen Reactions  . Penicillins Swelling and Rash    Has patient had a PCN reaction causing immediate rash, facial/tongue/throat swelling, SOB or lightheadedness with hypotension: no Has patient had a PCN reaction causing severe rash involving mucus membranes or skin necrosis: No Has patient had a PCN reaction that required hospitalization No Has patient had a PCN reaction occurring within the last 10 years: No If all of the above answers are "NO", then may proceed with Cephalosporin use.      PHYSICAL EXAM:  ECOG Performance status: 1  Vitals:   04/27/18 1103  BP: (!) 154/89  Pulse: 83  Resp: 16  Temp: (!) 97.2 F (36.2 C)  SpO2: 100%   There were no vitals filed for this visit.  Physical Exam Constitutional:      Appearance: Normal appearance. She is normal weight.  Musculoskeletal: Normal range of motion.  Skin:    General: Skin is warm and dry.  Neurological:     Mental Status: She is alert and oriented to person, place, and time. Mental status is at baseline.  Psychiatric:        Mood and Affect: Mood normal.        Behavior: Behavior normal.        Thought Content: Thought content normal.        Judgment: Judgment normal.      LABORATORY DATA:  I have reviewed the labs as listed.  CBC    Component Value Date/Time   WBC 5.0 04/20/2018 1524   RBC 5.78 (H) 04/20/2018 1524   HGB 13.4 04/20/2018 1524   HGB 8.4 (L) 09/07/2017 1451   HCT 45.0 04/20/2018 1524   HCT 31.5 (L) 09/07/2017 1451   PLT 305 04/20/2018 1524   PLT 533 (H) 09/07/2017 1451   MCV 77.9 (L) 04/20/2018 1524   MCV 58 (L) 09/07/2017 1451   MCH 23.2 (L) 04/20/2018 1524   MCHC 29.8 (L) 04/20/2018 1524   RDW 17.0 (H) 04/20/2018 1524   RDW 20.7 (H) 09/07/2017 1451   LYMPHSABS 2.3 04/20/2018 1524   LYMPHSABS 2.8 09/07/2017 1451   MONOABS 0.4 04/20/2018 1524   EOSABS 0.3 04/20/2018 1524   EOSABS 0.1 09/07/2017  1451   BASOSABS 0.0 04/20/2018 1524   BASOSABS 0.0 09/07/2017 1451   CMP Latest Ref Rng & Units 04/20/2018 12/07/2017 09/07/2017  Glucose 70 - 99 mg/dL 82 89 91  BUN 6 - 20 mg/dL 10 8 14   Creatinine 0.44 - 1.00 mg/dL 4.09 8.11 9.14(N)  Sodium 135 - 145 mmol/L  138 138 138  Potassium 3.5 - 5.1 mmol/L 4.1 3.8 4.1  Chloride 98 - 111 mmol/L 106 108 101  CO2 22 - 32 mmol/L 25 24 21   Calcium 8.9 - 10.3 mg/dL 4.0(J8.7(L) 8.1(X8.6(L) 9.1  Total Protein 6.5 - 8.1 g/dL 8.1 7.7 8.4  Total Bilirubin 0.3 - 1.2 mg/dL 0.5 0.6 0.4  Alkaline Phos 38 - 126 U/L 40 44 45  AST 15 - 41 U/L 13(L) 13(L) 13  ALT 0 - 44 U/L 13 10 6        DIAGNOSTIC IMAGING:  I have independently reviewed the scans and discussed with the patient.   I have reviewed April Budandi Oline Belk, NP's note and agree with the documentation.  I personally performed a face-to-face visit, made revisions and my assessment and plan is as follows.    ASSESSMENT & PLAN:   Chronic iron deficiency anemia 1.  Iron deficiency anemia: - She has menorrhagia with 2 days of heavy bleeding.  (5-7 days/q28). - She was reportedly started on oral contraceptive pills 2 months ago and she has noticed decrease in bleeding.  She ran out of them at this time. -Received Injectafer on 10/26/2017 and 11/02/2017.  She felt better for a few weeks after receiving infusion. -Lately she has been feeling very weak. -We reviewed her blood work today.  Hemoglobin is 13.4.  However ferritin has decreased to 7 from 25 at last visit. -Hands have recommended 2 infusions of Feraheme weekly x2.  We talked about side effects including serious allergic reactions. - I have also recommended her to start taking 1 iron tablet daily.  She has been off of iron for the last 1 year.  She did not have any side effects from it.  At that time it was thought to be a not helping her anemia.      Orders placed this encounter:  Orders Placed This Encounter  Procedures  . CBC with Differential/Platelet   . Comprehensive metabolic panel  . Ferritin  . Iron and TIBC  . Vitamin B12  . Folate  . Lactate dehydrogenase      Doreatha MassedSreedhar Katragadda, MD [facility] [id number]

## 2018-04-29 ENCOUNTER — Other Ambulatory Visit: Payer: Self-pay | Admitting: Advanced Practice Midwife

## 2018-05-14 ENCOUNTER — Encounter (HOSPITAL_COMMUNITY): Payer: Self-pay

## 2018-05-14 ENCOUNTER — Other Ambulatory Visit: Payer: Self-pay

## 2018-05-14 ENCOUNTER — Inpatient Hospital Stay (HOSPITAL_COMMUNITY): Payer: BC Managed Care – PPO | Attending: Hematology

## 2018-05-14 VITALS — BP 149/106 | HR 84 | Temp 98.9°F | Resp 18

## 2018-05-14 DIAGNOSIS — I10 Essential (primary) hypertension: Secondary | ICD-10-CM | POA: Insufficient documentation

## 2018-05-14 DIAGNOSIS — D500 Iron deficiency anemia secondary to blood loss (chronic): Secondary | ICD-10-CM | POA: Insufficient documentation

## 2018-05-14 DIAGNOSIS — D509 Iron deficiency anemia, unspecified: Secondary | ICD-10-CM

## 2018-05-14 DIAGNOSIS — Z79899 Other long term (current) drug therapy: Secondary | ICD-10-CM | POA: Diagnosis not present

## 2018-05-14 DIAGNOSIS — N920 Excessive and frequent menstruation with regular cycle: Secondary | ICD-10-CM | POA: Insufficient documentation

## 2018-05-14 MED ORDER — SODIUM CHLORIDE 0.9 % IV SOLN
INTRAVENOUS | Status: AC
  Administered 2018-05-14: 15:00:00 via INTRAVENOUS

## 2018-05-14 MED ORDER — SODIUM CHLORIDE 0.9 % IV SOLN
510.0000 mg | Freq: Once | INTRAVENOUS | Status: AC
  Administered 2018-05-14: 510 mg via INTRAVENOUS
  Filled 2018-05-14: qty 17

## 2018-05-14 NOTE — Progress Notes (Unsigned)
Feraheme given per orders. Patient tolerated it well without problems. Vitals stable and discharged home from clinic ambulatory. Follow up as scheduled.  

## 2018-05-14 NOTE — Patient Instructions (Signed)
[facility] at [facility] Discharge Instructions  feraheme given today Follow up as scheduled.   Thank you for choosing [facility] at [facility] to provide your oncology and hematology care.  To afford each patient quality time with our provider, please arrive at least 15 minutes before your scheduled appointment time.   If you have a lab appointment with the Cancer Center please come in thru the  Main Entrance and check in at the main information desk  You need to re-schedule your appointment should you arrive 10 or more minutes late.  We strive to give you quality time with our providers, and arriving late affects you and other patients whose appointments are after yours.  Also, if you no show three or more times for appointments you may be dismissed from the clinic at the providers discretion.     Again, thank you for choosing [facility].  Our hope is that these requests will decrease the amount of time that you wait before being seen by our physicians.       _____________________________________________________________  Should you have questions after your visit to [facility], please contact our office at [phone] between the hours of 8:00 a.m. and 4:30 p.m.  Voicemails left after 4:00 p.m. will not be returned until the following business day.  For prescription refill requests, have your pharmacy contact our office and allow 72 hours.    Cancer Center Support Programs:   > Cancer Support Group  2nd Tuesday of the month 1pm-2pm, Journey Room   

## 2018-05-21 ENCOUNTER — Other Ambulatory Visit: Payer: Self-pay

## 2018-05-21 ENCOUNTER — Encounter (HOSPITAL_COMMUNITY): Payer: Self-pay

## 2018-05-21 ENCOUNTER — Inpatient Hospital Stay (HOSPITAL_COMMUNITY): Payer: BC Managed Care – PPO

## 2018-05-21 VITALS — BP 136/93 | HR 70 | Temp 98.4°F | Resp 18

## 2018-05-21 DIAGNOSIS — D500 Iron deficiency anemia secondary to blood loss (chronic): Secondary | ICD-10-CM | POA: Diagnosis not present

## 2018-05-21 DIAGNOSIS — D509 Iron deficiency anemia, unspecified: Secondary | ICD-10-CM

## 2018-05-21 MED ORDER — SODIUM CHLORIDE 0.9 % IV SOLN
510.0000 mg | Freq: Once | INTRAVENOUS | Status: AC
  Administered 2018-05-21: 510 mg via INTRAVENOUS
  Filled 2018-05-21: qty 17

## 2018-05-21 MED ORDER — SODIUM CHLORIDE 0.9 % IV SOLN
Freq: Once | INTRAVENOUS | Status: AC
  Administered 2018-05-21: 15:00:00 via INTRAVENOUS

## 2018-05-21 NOTE — Patient Instructions (Signed)
[facility] at [facility]  Discharge Instructions:   _______________________________________________________________  Thank you for choosing [facility] at [facility] to provide your oncology and hematology care.  To afford each patient quality time with our providers, please arrive at least 15 minutes before your scheduled appointment.  You need to re-schedule your appointment if you arrive 10 or more minutes late.  We strive to give you quality time with our providers, and arriving late affects you and other patients whose appointments are after yours.  Also, if you no show three or more times for appointments you may be dismissed from the clinic.  Again, thank you for choosing [facility] at [facility]. Our hope is that these requests will allow you access to exceptional care and in a timely manner. _______________________________________________________________  If you have questions after your visit, please contact our office at [phone] between the hours of 8:30 a.m. and 5:00 p.m. Voicemails left after 4:30 p.m. will not be returned until the following business day. _______________________________________________________________  For prescription refill requests, have your pharmacy contact our office. _______________________________________________________________  Recommendations made by the consultant and any test results will be sent to your referring physician. _______________________________________________________________ 

## 2018-05-21 NOTE — Progress Notes (Signed)
Pt presents today for Feraheme infusion. BP elevated due to anxiety about the needle stick per pt's words. Pt takes Norvasc 10 mg daily, Hydrodiuril 25mg , and Lisinopril 5 mg daily. Pt took medication as directed by physician. No complaints of any changes since last visit. Pt states normal BP range on BP medication is 145/ 85. Pt denies any headache, blurred vision, or dizziness.   Feraheme given today per MD orders. Tolerated infusion without adverse affects. Vital signs stable. No complaints at this time. Discharged from clinic ambulatory. F/U with [facility] as scheduled.

## 2018-05-24 ENCOUNTER — Ambulatory Visit (INDEPENDENT_AMBULATORY_CARE_PROVIDER_SITE_OTHER): Payer: BC Managed Care – PPO | Admitting: Family Medicine

## 2018-05-24 ENCOUNTER — Encounter: Payer: Self-pay | Admitting: Family Medicine

## 2018-05-24 ENCOUNTER — Telehealth: Payer: Self-pay | Admitting: Family Medicine

## 2018-05-24 ENCOUNTER — Telehealth: Payer: Self-pay | Admitting: *Deleted

## 2018-05-24 VITALS — BP 138/96 | Temp 97.6°F | Wt 185.2 lb

## 2018-05-24 DIAGNOSIS — I10 Essential (primary) hypertension: Secondary | ICD-10-CM | POA: Diagnosis not present

## 2018-05-24 MED ORDER — NAPROXEN 500 MG PO TBEC: DELAYED_RELEASE_TABLET | ORAL | 2 refills | Status: DC

## 2018-05-24 MED ORDER — LISINOPRIL 10 MG PO TABS: 5.0000 mg | ORAL_TABLET | ORAL | 0 refills | Status: DC

## 2018-05-24 NOTE — Telephone Encounter (Signed)
Pt contacted nurse line over weekend due to BP being elevated. Her BP over the weekend was 152/120 and 148/111. This morning when pt got to work and her BP is 136/88. She is wanting to know if she should come in for a check on BP medications. Also she is having a migraine and has had that all weekend. She is wondering if that what caused the increase in her BP.   Also she would like to know if something OTC can be recommended for migraines or if she can get a refill on isometheptene-acetaminophen-dichloralphenazone (MIDRIN) 65-325-100 MG capsule.   Please send to  [id number] - [place], [place] - [address] AT [facility]

## 2018-05-24 NOTE — Progress Notes (Signed)
   Subjective:    Patient ID: April Finley, female    DOB: May 12, 1976, 43 y.o.   MRN: [id number]  Headache   This is a new problem. The current episode started in the past 7 days. Pain location: behind eyes mainly;pt states that if she cuts bright lights off and applies pressure to eyelid the pressure reduces. The pain does not radiate. The quality of the pain is described as dull. Associated symptoms comments: Blood pressure has been up. Treatments tried: aleve, store brand migraine relief, BC  The treatment provided mild relief.   Pt states that she has had 2 iron infusions in the past two weeks. Pt had BP took at work this morning and it was 124/88.  High blood pressure mostly for the past yr  Gets migranes, not very often, but when it hits its rough   Gets nausea with the ususally, gets the h a's with them sometimes  Bottom diastolic raised as high as 120 per pt   Mo has hx of high blood pressure  '    h a is a deep pressure     Review of Systems  Neurological: Positive for headaches.       Objective:   Physical Exam  Alert vitals stable, NAD. Blood still elevated upon repeat HEENT normal. Lungs clear. Heart regular rate and rhythm.       Assessment & Plan:  Impression hypertension.  Suboptimal control.  Reviewed numbers over the last half year show ongoing elevation patient also having challenges with migraine.  Having difficulty sorting out whether this headache is any more substantial migraine versus headache associated with blood pressure.  Advised patient she can use Naprosyn for this increase lisinopril from 5 to 10 mg follow-up

## 2018-05-24 NOTE — Telephone Encounter (Signed)
I spoke with the patient and she states she has had elevated bp and along with this has had a headache. I advised that they could be connected and she would need to be evaluated to see if the bp is what is causing. She states she does not have any dizziness or any other symptoms. He bp this am is 126/88 and her headache is not as bad a present. . Patient was told to be here today for an appt,but states she can only be here this evening for an appt as she is at work She was transferred up front to set up an appt for today to be evaluated at 3:45

## 2018-06-21 ENCOUNTER — Ambulatory Visit: Payer: BC Managed Care – PPO | Admitting: Family Medicine

## 2018-06-21 ENCOUNTER — Encounter: Payer: Self-pay | Admitting: Family Medicine

## 2018-06-21 VITALS — BP 110/78 | Wt 188.8 lb

## 2018-06-21 DIAGNOSIS — I10 Essential (primary) hypertension: Secondary | ICD-10-CM | POA: Diagnosis not present

## 2018-06-21 NOTE — Patient Instructions (Signed)
DASH Eating Plan  DASH stands for "Dietary Approaches to Stop Hypertension." The DASH eating plan is a healthy eating plan that has been shown to reduce high blood pressure (hypertension). It may also reduce your risk for type 2 diabetes, heart disease, and stroke. The DASH eating plan may also help with weight loss.  What are tips for following this plan?    General guidelines   Avoid eating more than 2,300 mg (milligrams) of salt (sodium) a day. If you have hypertension, you may need to reduce your sodium intake to 1,500 mg a day.   Limit alcohol intake to no more than 1 drink a day for nonpregnant women and 2 drinks a day for men. One drink equals 12 oz of beer, 5 oz of wine, or 1 oz of hard liquor.   Work with your health care provider to maintain a healthy body weight or to lose weight. Ask what an ideal weight is for you.   Get at least 30 minutes of exercise that causes your heart to beat faster (aerobic exercise) most days of the week. Activities may include walking, swimming, or biking.   Work with your health care provider or diet and nutrition specialist (dietitian) to adjust your eating plan to your individual calorie needs.  Reading food labels     Check food labels for the amount of sodium per serving. Choose foods with less than 5 percent of the Daily Value of sodium. Generally, foods with less than 300 mg of sodium per serving fit into this eating plan.   To find whole grains, look for the word "whole" as the first word in the ingredient list.  Shopping   Buy products labeled as "low-sodium" or "no salt added."   Buy fresh foods. Avoid canned foods and premade or frozen meals.  Cooking   Avoid adding salt when cooking. Use salt-free seasonings or herbs instead of table salt or sea salt. Check with your health care provider or pharmacist before using salt substitutes.   Do not fry foods. Cook foods using healthy methods such as baking, boiling, grilling, and broiling instead.   Cook with  heart-healthy oils, such as olive, canola, soybean, or sunflower oil.  Meal planning   Eat a balanced diet that includes:  ? 5 or more servings of fruits and vegetables each day. At each meal, try to fill half of your plate with fruits and vegetables.  ? Up to 6-8 servings of whole grains each day.  ? Less than 6 oz of lean meat, poultry, or fish each day. A 3-oz serving of meat is about the same size as a deck of cards. One egg equals 1 oz.  ? 2 servings of low-fat dairy each day.  ? A serving of nuts, seeds, or beans 5 times each week.  ? Heart-healthy fats. Healthy fats called Omega-3 fatty acids are found in foods such as flaxseeds and coldwater fish, like sardines, salmon, and mackerel.   Limit how much you eat of the following:  ? Canned or prepackaged foods.  ? Food that is high in trans fat, such as fried foods.  ? Food that is high in saturated fat, such as fatty meat.  ? Sweets, desserts, sugary drinks, and other foods with added sugar.  ? Full-fat dairy products.   Do not salt foods before eating.   Try to eat at least 2 vegetarian meals each week.   Eat more home-cooked food and less restaurant, buffet, and fast food.     When eating at a restaurant, ask that your food be prepared with less salt or no salt, if possible.  What foods are recommended?  The items listed may not be a complete list. Talk with your dietitian about what dietary choices are best for you.  Grains  Whole-grain or whole-wheat bread. Whole-grain or whole-wheat pasta. Brown rice. Oatmeal. Quinoa. Bulgur. Whole-grain and low-sodium cereals. Pita bread. Low-fat, low-sodium crackers. Whole-wheat flour tortillas.  Vegetables  Fresh or frozen vegetables (raw, steamed, roasted, or grilled). Low-sodium or reduced-sodium tomato and vegetable juice. Low-sodium or reduced-sodium tomato sauce and tomato paste. Low-sodium or reduced-sodium canned vegetables.  Fruits  All fresh, dried, or frozen fruit. Canned fruit in natural juice (without  added sugar).  Meat and other protein foods  Skinless chicken or turkey. Ground chicken or turkey. Pork with fat trimmed off. Fish and seafood. Egg whites. Dried beans, peas, or lentils. Unsalted nuts, nut butters, and seeds. Unsalted canned beans. Lean cuts of beef with fat trimmed off. Low-sodium, lean deli meat.  Dairy  Low-fat (1%) or fat-free (skim) milk. Fat-free, low-fat, or reduced-fat cheeses. Nonfat, low-sodium ricotta or cottage cheese. Low-fat or nonfat yogurt. Low-fat, low-sodium cheese.  Fats and oils  Soft margarine without trans fats. Vegetable oil. Low-fat, reduced-fat, or light mayonnaise and salad dressings (reduced-sodium). Canola, safflower, olive, soybean, and sunflower oils. Avocado.  Seasoning and other foods  Herbs. Spices. Seasoning mixes without salt. Unsalted popcorn and pretzels. Fat-free sweets.  What foods are not recommended?  The items listed may not be a complete list. Talk with your dietitian about what dietary choices are best for you.  Grains  Baked goods made with fat, such as croissants, muffins, or some breads. Dry pasta or rice meal packs.  Vegetables  Creamed or fried vegetables. Vegetables in a cheese sauce. Regular canned vegetables (not low-sodium or reduced-sodium). Regular canned tomato sauce and paste (not low-sodium or reduced-sodium). Regular tomato and vegetable juice (not low-sodium or reduced-sodium). Pickles. Olives.  Fruits  Canned fruit in a light or heavy syrup. Fried fruit. Fruit in cream or butter sauce.  Meat and other protein foods  Fatty cuts of meat. Ribs. Fried meat. Bacon. Sausage. Bologna and other processed lunch meats. Salami. Fatback. Hotdogs. Bratwurst. Salted nuts and seeds. Canned beans with added salt. Canned or smoked fish. Whole eggs or egg yolks. Chicken or turkey with skin.  Dairy  Whole or 2% milk, cream, and half-and-half. Whole or full-fat cream cheese. Whole-fat or sweetened yogurt. Full-fat cheese. Nondairy creamers. Whipped toppings.  Processed cheese and cheese spreads.  Fats and oils  Butter. Stick margarine. Lard. Shortening. Ghee. Bacon fat. Tropical oils, such as coconut, palm kernel, or palm oil.  Seasoning and other foods  Salted popcorn and pretzels. Onion salt, garlic salt, seasoned salt, table salt, and sea salt. Worcestershire sauce. Tartar sauce. Barbecue sauce. Teriyaki sauce. Soy sauce, including reduced-sodium. Steak sauce. Canned and packaged gravies. Fish sauce. Oyster sauce. Cocktail sauce. Horseradish that you find on the shelf. Ketchup. Mustard. Meat flavorings and tenderizers. Bouillon cubes. Hot sauce and Tabasco sauce. Premade or packaged marinades. Premade or packaged taco seasonings. Relishes. Regular salad dressings.  Where to find more information:   National Heart, Lung, and Blood Institute: www.nhlbi.nih.gov   American Heart Association: www.heart.org  Summary   The DASH eating plan is a healthy eating plan that has been shown to reduce high blood pressure (hypertension). It may also reduce your risk for type 2 diabetes, heart disease, and stroke.   With the   DASH eating plan, you should limit salt (sodium) intake to 2,300 mg a day. If you have hypertension, you may need to reduce your sodium intake to 1,500 mg a day.   When on the DASH eating plan, aim to eat more fresh fruits and vegetables, whole grains, lean proteins, low-fat dairy, and heart-healthy fats.   Work with your health care provider or diet and nutrition specialist (dietitian) to adjust your eating plan to your individual calorie needs.  This information is not intended to replace advice given to you by your health care provider. Make sure you discuss any questions you have with your health care provider.  Document Released: 04/17/2011 Document Revised: 04/21/2016 Document Reviewed: 04/21/2016  Elsevier Interactive Patient Education  2019 [employer]

## 2018-06-21 NOTE — Progress Notes (Signed)
   Subjective:    Patient ID: April Finley, female    DOB: Oct 07, 1975, 43 y.o.   MRN: [id number]  Hypertension  This is a chronic problem. The current episode started more than 1 month ago. The problem has been gradually improving since onset. The problem is controlled. Pertinent negatives include no chest pain, headaches, neck pain, orthopnea, PND or shortness of breath. There are no associated agents to hypertension. There are no known risk factors for coronary artery disease. The current treatment provides mild improvement. There are no compliance problems.    Patient arrives for a follow up on blood pressure. Patient states her blood pressures have been running better at home. Review of Systems  Constitutional: Negative for activity change, appetite change and fatigue.  HENT: Negative for congestion and rhinorrhea.   Respiratory: Negative for cough and shortness of breath.   Cardiovascular: Negative for chest pain, orthopnea, leg swelling and PND.  Gastrointestinal: Negative for abdominal pain and diarrhea.  Endocrine: Negative for polydipsia and polyphagia.  Musculoskeletal: Negative for neck pain.  Skin: Negative for color change.  Neurological: Negative for dizziness, weakness and headaches.  Psychiatric/Behavioral: Negative for behavioral problems and confusion.       Objective:   Physical Exam Vitals signs reviewed.  Constitutional:      General: She is not in acute distress. HENT:     Head: Normocephalic and atraumatic.  Eyes:     General:        Right eye: No discharge.        Left eye: No discharge.  Neck:     Trachea: No tracheal deviation.  Cardiovascular:     Rate and Rhythm: Normal rate and regular rhythm.     Heart sounds: Normal heart sounds. No murmur.  Pulmonary:     Effort: Pulmonary effort is normal. No respiratory distress.     Breath sounds: Normal breath sounds.  Lymphadenopathy:     Cervical: No cervical adenopathy.  Skin:    General: Skin is warm  and dry.  Neurological:     Mental Status: She is alert.     Coordination: Coordination normal.  Psychiatric:        Behavior: Behavior normal.           Assessment & Plan:  Blood pressure Under good control Continue taking current medication Watch diet closely minimize salt Stay physically active   With patient having intermittent heavy vaginal bleeding I encouraged her to discuss with gynecology the possibility of ablation otherwise she will continue doing her iron infusions at the hospital with hematology

## 2018-07-12 NOTE — Telephone Encounter (Signed)
Note sent to nurse. 

## 2018-08-06 ENCOUNTER — Other Ambulatory Visit (HOSPITAL_COMMUNITY): Payer: Self-pay | Admitting: Nurse Practitioner

## 2018-08-19 ENCOUNTER — Other Ambulatory Visit (HOSPITAL_COMMUNITY): Payer: BC Managed Care – PPO

## 2018-08-20 ENCOUNTER — Other Ambulatory Visit (HOSPITAL_COMMUNITY): Payer: BC Managed Care – PPO

## 2018-08-27 ENCOUNTER — Ambulatory Visit (HOSPITAL_COMMUNITY): Payer: BC Managed Care – PPO | Admitting: Nurse Practitioner

## 2018-09-09 ENCOUNTER — Other Ambulatory Visit: Payer: Self-pay

## 2018-09-09 MED ORDER — LISINOPRIL 10 MG PO TABS: 5.0000 mg | ORAL_TABLET | ORAL | 0 refills | Status: DC

## 2018-10-05 ENCOUNTER — Inpatient Hospital Stay (HOSPITAL_COMMUNITY): Payer: BC Managed Care – PPO | Attending: Hematology

## 2018-10-05 ENCOUNTER — Other Ambulatory Visit: Payer: Self-pay

## 2018-10-05 DIAGNOSIS — D509 Iron deficiency anemia, unspecified: Secondary | ICD-10-CM | POA: Diagnosis not present

## 2018-10-05 DIAGNOSIS — N920 Excessive and frequent menstruation with regular cycle: Secondary | ICD-10-CM | POA: Insufficient documentation

## 2018-10-05 DIAGNOSIS — Z79899 Other long term (current) drug therapy: Secondary | ICD-10-CM | POA: Diagnosis not present

## 2018-10-05 LAB — CBC WITH DIFFERENTIAL/PLATELET
Abs Immature Granulocytes: 0.01 10*3/uL (ref 0.00–0.07)
Basophils Absolute: 0 10*3/uL (ref 0.0–0.1)
Basophils Relative: 1 %
Eosinophils Absolute: 0.1 10*3/uL (ref 0.0–0.5)
Eosinophils Relative: 3 %
HCT: 48.3 % — ABNORMAL HIGH (ref 36.0–46.0)
Hemoglobin: 15.3 g/dL — ABNORMAL HIGH (ref 12.0–15.0)
Immature Granulocytes: 0 %
Lymphocytes Relative: 37 %
Lymphs Abs: 1.7 10*3/uL (ref 0.7–4.0)
MCH: 27.3 pg (ref 26.0–34.0)
MCHC: 31.7 g/dL (ref 30.0–36.0)
MCV: 86.3 fL (ref 80.0–100.0)
Monocytes Absolute: 0.4 10*3/uL (ref 0.1–1.0)
Monocytes Relative: 7 %
Neutro Abs: 2.5 10*3/uL (ref 1.7–7.7)
Neutrophils Relative %: 52 %
Platelets: 301 10*3/uL (ref 150–400)
RBC: 5.6 MIL/uL — ABNORMAL HIGH (ref 3.87–5.11)
RDW: 12.4 % (ref 11.5–15.5)
WBC: 4.7 10*3/uL (ref 4.0–10.5)
nRBC: 0 % (ref 0.0–0.2)

## 2018-10-05 LAB — COMPREHENSIVE METABOLIC PANEL
ALT: 12 U/L (ref 0–44)
AST: 14 U/L — ABNORMAL LOW (ref 15–41)
Albumin: 4.2 g/dL (ref 3.5–5.0)
Alkaline Phosphatase: 34 U/L — ABNORMAL LOW (ref 38–126)
Anion gap: 12 (ref 5–15)
BUN: 13 mg/dL (ref 6–20)
CO2: 24 mmol/L (ref 22–32)
Calcium: 9.4 mg/dL (ref 8.9–10.3)
Chloride: 102 mmol/L (ref 98–111)
Creatinine, Ser: 0.8 mg/dL (ref 0.44–1.00)
GFR calc Af Amer: >60 mL/min (ref >60)
GFR calc non Af Amer: >60 mL/min (ref >60)
Glucose, Bld: 96 mg/dL (ref 70–99)
Potassium: 3.5 mmol/L (ref 3.5–5.1)
Sodium: 138 mmol/L (ref 135–145)
Total Bilirubin: 0.4 mg/dL (ref 0.3–1.2)
Total Protein: 8.3 g/dL — ABNORMAL HIGH (ref 6.5–8.1)

## 2018-10-05 LAB — FERRITIN: Ferritin: 12 ng/mL (ref 11–307)

## 2018-10-05 LAB — IRON AND TIBC
Iron: 25 ug/dL — ABNORMAL LOW (ref 28–170)
Saturation Ratios: 7 % — ABNORMAL LOW (ref 10.4–31.8)
TIBC: 363 ug/dL (ref 250–450)
UIBC: 338 ug/dL

## 2018-10-05 LAB — FOLATE: Folate: 12 ng/mL (ref >5.9)

## 2018-10-05 LAB — VITAMIN B12: Vitamin B-12: 382 pg/mL (ref 180–914)

## 2018-10-05 LAB — LACTATE DEHYDROGENASE: LDH: 126 U/L (ref 98–192)

## 2018-10-06 ENCOUNTER — Other Ambulatory Visit: Payer: Self-pay

## 2018-10-06 MED ORDER — AMLODIPINE BESYLATE 10 MG PO TABS: 10.0000 mg | ORAL_TABLET | Freq: Every day | ORAL | 5 refills | Status: DC

## 2018-10-06 MED ORDER — HYDROCHLOROTHIAZIDE 25 MG PO TABS: 25.0000 mg | ORAL_TABLET | Freq: Every day | ORAL | 5 refills | Status: DC

## 2018-10-08 ENCOUNTER — Other Ambulatory Visit: Payer: Self-pay

## 2018-10-08 ENCOUNTER — Inpatient Hospital Stay (HOSPITAL_COMMUNITY): Payer: BC Managed Care – PPO | Admitting: Nurse Practitioner

## 2018-10-08 DIAGNOSIS — D509 Iron deficiency anemia, unspecified: Secondary | ICD-10-CM | POA: Diagnosis not present

## 2018-10-08 DIAGNOSIS — N920 Excessive and frequent menstruation with regular cycle: Secondary | ICD-10-CM | POA: Diagnosis not present

## 2018-10-08 DIAGNOSIS — Z79899 Other long term (current) drug therapy: Secondary | ICD-10-CM | POA: Diagnosis not present

## 2018-10-08 NOTE — Progress Notes (Signed)
[facility] [address], [place] [address]   CLINIC:  Medical Oncology/Hematology  PCP:  Babs Sciara, MD [address] [place] [address] [phone]   REASON FOR VISIT: Follow-up for iron deficiency anemia  CURRENT THERAPY: Intermittent iron infusions   INTERVAL HISTORY:  Ms. April Finley 43 y.o. female returns for routine follow-up for iron deficiency anemia.  She reports she is starting to feel a little more fatigued during the day.  She denies any headaches. Denies any nausea, vomiting, or diarrhea. Denies any new pains. Had not noticed any recent bleeding such as epistaxis, hematuria or hematochezia. Denies recent chest pain on exertion, shortness of breath on minimal exertion, pre-syncopal episodes, or palpitations. Denies any numbness or tingling in hands or feet. Denies any recent fevers, infections, or recent hospitalizations. Patient reports appetite at 50% and energy level at 25%.  He is eating well and maintaining her weight at this time.    REVIEW OF SYSTEMS:  Review of Systems  Constitutional: Positive for fatigue.  Psychiatric/Behavioral: Positive for sleep disturbance.  All other systems reviewed and are negative.    PAST MEDICAL/SURGICAL HISTORY:  Past Medical History:  Diagnosis Date  . Hypertension   . Migraine    No past surgical history on file.   SOCIAL HISTORY:  Social History   Socioeconomic History  . Marital status: Divorced    Spouse name: Not on file  . Number of children: Not on file  . Years of education: Not on file  . Highest education level: Not on file  Occupational History  . Not on file  Social Needs  . Financial resource strain: Not on file  . Food insecurity:    Worry: Not on file    Inability: Not on file  . Transportation needs:    Medical: Not on file    Non-medical: Not on file  Tobacco Use  . Smoking status: Never Smoker  . Smokeless tobacco: Never Used  Substance and Sexual  Activity  . Alcohol use: No  . Drug use: No  . Sexual activity: Not Currently  Lifestyle  . Physical activity:    Days per week: Not on file    Minutes per session: Not on file  . Stress: Not on file  Relationships  . Social connections:    Talks on phone: Not on file    Gets together: Not on file    Attends religious service: Not on file    Active member of club or organization: Not on file    Attends meetings of clubs or organizations: Not on file    Relationship status: Not on file  . Intimate partner violence:    Fear of current or ex partner: Not on file    Emotionally abused: Not on file    Physically abused: Not on file    Forced sexual activity: Not on file  Other Topics Concern  . Not on file  Social History Narrative  . Not on file    FAMILY HISTORY:  Family History  Problem Relation Age of Onset  . Hypertension Mother   . Kidney disease Mother        lupus,HTN  . Hypertension Brother   . Heart disease Maternal Grandmother   . Hyperlipidemia Maternal Grandmother   . Hypertension Maternal Grandmother   . Cancer Maternal Grandfather        colon    CURRENT MEDICATIONS:  Outpatient Encounter Medications as of 10/08/2018  Medication  Sig  . amLODipine (NORVASC) 10 MG tablet Take 1 tablet (10 mg total) by mouth daily.  . hydrochlorothiazide (HYDRODIURIL) 25 MG tablet Take 1 tablet (25 mg total) by mouth daily.  Marland Kitchen lisinopril (ZESTRIL) 10 MG tablet Take 10 mg by mouth daily.  . naproxen (NAPROXEN DR) 500 MG EC tablet TAKE 1 TABLET BY MOUTH EVERY 12 HOURS AS NEEDED FOR CRAMPING - TAKE WITH FOOD  . [DISCONTINUED] lisinopril (ZESTRIL) 10 MG tablet Take 0.5 tablets (5 mg total) by mouth every morning.  Marland Kitchen ALPRAZolam (XANAX) 1 MG tablet Take 1 tablet (1 mg total) by mouth at bedtime as needed. for sleep (Patient not taking: Reported on 10/08/2018)  . [DISCONTINUED] lisinopril (ZESTRIL) 5 MG tablet Take 10 mg by mouth daily.    Facility-Administered Encounter Medications  as of 10/08/2018  Medication  . 0.9 %  sodium chloride infusion    ALLERGIES:  Allergies  Allergen Reactions  . Penicillins Swelling and Rash    Has patient had a PCN reaction causing immediate rash, facial/tongue/throat swelling, SOB or lightheadedness with hypotension: no Has patient had a PCN reaction causing severe rash involving mucus membranes or skin necrosis: No Has patient had a PCN reaction that required hospitalization No Has patient had a PCN reaction occurring within the last 10 years: No If all of the above answers are "NO", then may proceed with Cephalosporin use.      PHYSICAL EXAM:  ECOG Performance status: 1  Vitals:   10/08/18 0910  BP: 122/83  Pulse: 84  Resp: 18  Temp: 97.7 F (36.5 C)  SpO2: 100%   Filed Weights   10/08/18 0910  Weight: 184 lb (83.5 kg)    Physical Exam Constitutional:      Appearance: Normal appearance. She is normal weight.  Cardiovascular:     Rate and Rhythm: Normal rate and regular rhythm.     Heart sounds: Normal heart sounds.  Pulmonary:     Effort: Pulmonary effort is normal.     Breath sounds: Normal breath sounds.  Abdominal:     General: Bowel sounds are normal.     Palpations: Abdomen is soft.  Musculoskeletal: Normal range of motion.  Skin:    General: Skin is warm and dry.  Neurological:     Mental Status: She is alert and oriented to person, place, and time. Mental status is at baseline.  Psychiatric:        Mood and Affect: Mood normal.        Behavior: Behavior normal.        Thought Content: Thought content normal.        Judgment: Judgment normal.      LABORATORY DATA:  I have reviewed the labs as listed.  CBC    Component Value Date/Time   WBC 4.7 10/05/2018 1314   RBC 5.60 (H) 10/05/2018 1314   HGB 15.3 (H) 10/05/2018 1314   HGB 8.4 (L) 09/07/2017 1451   HCT 48.3 (H) 10/05/2018 1314   HCT 31.5 (L) 09/07/2017 1451   PLT 301 10/05/2018 1314   PLT 533 (H) 09/07/2017 1451   MCV 86.3  10/05/2018 1314   MCV 58 (L) 09/07/2017 1451   MCH 27.3 10/05/2018 1314   MCHC 31.7 10/05/2018 1314   RDW 12.4 10/05/2018 1314   RDW 20.7 (H) 09/07/2017 1451   LYMPHSABS 1.7 10/05/2018 1314   LYMPHSABS 2.8 09/07/2017 1451   MONOABS 0.4 10/05/2018 1314   EOSABS 0.1 10/05/2018 1314   EOSABS 0.1 09/07/2017 1451  BASOSABS 0.0 10/05/2018 1314   BASOSABS 0.0 09/07/2017 1451   CMP Latest Ref Rng & Units 10/05/2018 04/20/2018 12/07/2017  Glucose 70 - 99 mg/dL 96 82 89  BUN 6 - 20 mg/dL 13 10 8   Creatinine 0.44 - 1.00 mg/dL 1.610.80 0.960.88 0.450.68  Sodium 135 - 145 mmol/L 138 138 138  Potassium 3.5 - 5.1 mmol/L 3.5 4.1 3.8  Chloride 98 - 111 mmol/L 102 106 108  CO2 22 - 32 mmol/L 24 25 24   Calcium 8.9 - 10.3 mg/dL 9.4 4.0(J8.7(L) 8.1(X8.6(L)  Total Protein 6.5 - 8.1 g/dL 8.3(H) 8.1 7.7  Total Bilirubin 0.3 - 1.2 mg/dL 0.4 0.5 0.6  Alkaline Phos 38 - 126 U/L 34(L) 40 44  AST 15 - 41 U/L 14(L) 13(L) 13(L)  ALT 0 - 44 U/L 12 13 10       I personally performed a face-to-face visit.  All questions were answered to patient's stated satisfaction. Encouraged patient to call with any new concerns or questions before his next visit to the cancer center and we can certain see him sooner, if needed.     ASSESSMENT & PLAN:   Chronic iron deficiency anemia 1.  Iron deficiency anemia: - She has menorrhagia with 2 days of heavy bleeding.  They are lasting 5 to 7 days every 28 days. -She reportedly was started on oral contraceptive pills 02/2018 and she has been noticing a decrease in her bleeding. - She last received Feraheme on 05/14/2018 and 05/21/2018. - She also was started on oral iron tablets daily. -Labs on 10/05/2018 showed her hemoglobin 15.3, ferritin 12, percent saturation 7, platelets 301.  Her vitamin B12 level is 382. -We will also recommend her taking vitamin B12 daily. - She will follow-up with us in 2 months with repeat labs.      Orders placed this encounter:  Orders Placed This Encounter   Procedures  . CBC with Differential/Platelet  . Comprehensive metabolic panel  . Ferritin  . Iron and TIBC  . Vitamin B12  . VITAMIN D 25 Hydroxy (Vit-D Deficiency, Fractures)  . Folate      Mathis Budandi Reeya Bound, FNP-C [facility] [phone]

## 2018-10-08 NOTE — Patient Instructions (Signed)
[facility] Discharge Instructions  Follow up in 2 months with labs Please start taking iron and vitamin B 12 daily.    Thank you for choosing [facility] to provide your oncology and hematology care.  To afford each patient quality time with our provider, please arrive at least 15 minutes before your scheduled appointment time.   If you have a lab appointment with the Cancer Center please come in thru the  Main Entrance and check in at the main information desk  You need to re-schedule your appointment should you arrive 10 or more minutes late.  We strive to give you quality time with our providers, and arriving late affects you and other patients whose appointments are after yours.  Also, if you no show three or more times for appointments you may be dismissed from the clinic at the providers discretion.     Again, thank you for choosing [facility].  Our hope is that these requests will decrease the amount of time that you wait before being seen by our physicians.       _____________________________________________________________  Should you have questions after your visit to [facility], please contact our office at [phone] between the hours of 8:00 a.m. and 4:30 p.m.  Voicemails left after 4:00 p.m. will not be returned until the following business day.  For prescription refill requests, have your pharmacy contact our office and allow 72 hours.    Cancer Center Support Programs:   > Cancer Support Group  2nd Tuesday of the month 1pm-2pm, Journey Room

## 2018-10-08 NOTE — Assessment & Plan Note (Signed)
1.  Iron deficiency anemia: - She has menorrhagia with 2 days of heavy bleeding.  They are lasting 5 to 7 days every 28 days. -She reportedly was started on oral contraceptive pills 02/2018 and she has been noticing a decrease in her bleeding. - She last received Feraheme on 05/14/2018 and 05/21/2018. - She also was started on oral iron tablets daily. -Labs on 10/05/2018 showed her hemoglobin 15.3, ferritin 12, percent saturation 7, platelets 301.  Her vitamin B12 level is 382. -We will also recommend her taking vitamin B12 daily. - She will follow-up with [place] in 2 months with repeat labs.

## 2018-11-01 ENCOUNTER — Other Ambulatory Visit: Payer: Self-pay | Admitting: Family Medicine

## 2018-12-07 ENCOUNTER — Inpatient Hospital Stay (HOSPITAL_COMMUNITY): Payer: BC Managed Care – PPO | Attending: Hematology

## 2018-12-07 ENCOUNTER — Other Ambulatory Visit: Payer: Self-pay

## 2018-12-07 DIAGNOSIS — D509 Iron deficiency anemia, unspecified: Secondary | ICD-10-CM

## 2018-12-07 DIAGNOSIS — N920 Excessive and frequent menstruation with regular cycle: Secondary | ICD-10-CM | POA: Diagnosis not present

## 2018-12-07 DIAGNOSIS — E559 Vitamin D deficiency, unspecified: Secondary | ICD-10-CM | POA: Insufficient documentation

## 2018-12-07 DIAGNOSIS — D500 Iron deficiency anemia secondary to blood loss (chronic): Secondary | ICD-10-CM | POA: Insufficient documentation

## 2018-12-07 LAB — COMPREHENSIVE METABOLIC PANEL
ALT: 15 U/L (ref 0–44)
AST: 14 U/L — ABNORMAL LOW (ref 15–41)
Albumin: 3.8 g/dL (ref 3.5–5.0)
Alkaline Phosphatase: 37 U/L — ABNORMAL LOW (ref 38–126)
Anion gap: 8 (ref 5–15)
BUN: 13 mg/dL (ref 6–20)
CO2: 26 mmol/L (ref 22–32)
Calcium: 8.9 mg/dL (ref 8.9–10.3)
Chloride: 105 mmol/L (ref 98–111)
Creatinine, Ser: 0.81 mg/dL (ref 0.44–1.00)
GFR calc Af Amer: >60 mL/min (ref >60)
GFR calc non Af Amer: >60 mL/min (ref >60)
Glucose, Bld: 102 mg/dL — ABNORMAL HIGH (ref 70–99)
Potassium: 3.7 mmol/L (ref 3.5–5.1)
Sodium: 139 mmol/L (ref 135–145)
Total Bilirubin: 0.8 mg/dL (ref 0.3–1.2)
Total Protein: 7.7 g/dL (ref 6.5–8.1)

## 2018-12-07 LAB — FOLATE: Folate: 10.2 ng/mL (ref >5.9)

## 2018-12-07 LAB — IRON AND TIBC
Iron: 27 ug/dL — ABNORMAL LOW (ref 28–170)
Saturation Ratios: 7 % — ABNORMAL LOW (ref 10.4–31.8)
TIBC: 382 ug/dL (ref 250–450)
UIBC: 355 ug/dL

## 2018-12-07 LAB — CBC WITH DIFFERENTIAL/PLATELET
Abs Immature Granulocytes: 0.01 10*3/uL (ref 0.00–0.07)
Basophils Absolute: 0 10*3/uL (ref 0.0–0.1)
Basophils Relative: 1 %
Eosinophils Absolute: 1.1 10*3/uL — ABNORMAL HIGH (ref 0.0–0.5)
Eosinophils Relative: 19 %
HCT: 45.3 % (ref 36.0–46.0)
Hemoglobin: 14 g/dL (ref 12.0–15.0)
Immature Granulocytes: 0 %
Lymphocytes Relative: 33 %
Lymphs Abs: 1.8 10*3/uL (ref 0.7–4.0)
MCH: 25.4 pg — ABNORMAL LOW (ref 26.0–34.0)
MCHC: 30.9 g/dL (ref 30.0–36.0)
MCV: 82.1 fL (ref 80.0–100.0)
Monocytes Absolute: 0.5 10*3/uL (ref 0.1–1.0)
Monocytes Relative: 9 %
Neutro Abs: 2.1 10*3/uL (ref 1.7–7.7)
Neutrophils Relative %: 38 %
Platelets: 269 10*3/uL (ref 150–400)
RBC: 5.52 MIL/uL — ABNORMAL HIGH (ref 3.87–5.11)
RDW: 14.3 % (ref 11.5–15.5)
WBC: 5.5 10*3/uL (ref 4.0–10.5)
nRBC: 0 % (ref 0.0–0.2)

## 2018-12-07 LAB — VITAMIN B12: Vitamin B-12: 289 pg/mL (ref 180–914)

## 2018-12-07 LAB — FERRITIN: Ferritin: 11 ng/mL (ref 11–307)

## 2018-12-08 ENCOUNTER — Inpatient Hospital Stay (HOSPITAL_BASED_OUTPATIENT_CLINIC_OR_DEPARTMENT_OTHER): Payer: BC Managed Care – PPO | Admitting: Hematology

## 2018-12-08 ENCOUNTER — Other Ambulatory Visit: Payer: Self-pay

## 2018-12-08 DIAGNOSIS — D509 Iron deficiency anemia, unspecified: Secondary | ICD-10-CM

## 2018-12-08 DIAGNOSIS — E559 Vitamin D deficiency, unspecified: Secondary | ICD-10-CM | POA: Diagnosis not present

## 2018-12-08 DIAGNOSIS — D500 Iron deficiency anemia secondary to blood loss (chronic): Secondary | ICD-10-CM | POA: Diagnosis not present

## 2018-12-08 LAB — VITAMIN D 25 HYDROXY (VIT D DEFICIENCY, FRACTURES): Vit D, 25-Hydroxy: 25.3 ng/mL — ABNORMAL LOW (ref 30.0–100.0)

## 2018-12-08 NOTE — Assessment & Plan Note (Addendum)
1.  Iron deficiency anemia: - She has menorrhagia with 2 days of heavy bleeding.  They are lasting 5 to 7 days every 28 days. -She reportedly was started on oral contraceptive pills 02/2018 and she has been noticing a decrease in her bleeding. - She last received Feraheme on 05/14/2018 and 05/21/2018. - She was started on oral iron tablets daily. - Most recent CBC reveals a hemoglobin of 14, ferritin level of 11, iron 27, TIBC 382 saturation of 7%.  I have instructed the patient to increase p.o. iron to twice a day.  We will reassess her ferritin and iron levels in 8 weeks.  If iron levels remain low would recommend IV Feraheme 510 mg x 2 doses.  2.  Vitamin D deficiency -Most recent vitamin D level was low at 25.  Have recommended patient begin vitamin D supplementation daily.

## 2018-12-08 NOTE — Progress Notes (Signed)
[facility] [address] [place], [place] [address]   CLINIC:  Medical Oncology/Hematology  PCP:  Babs SciaraLuking, Scott A, MD [address] [place] [address] [phone]   REASON FOR VISIT:  Follow-up for IDA  CURRENT THERAPY: Iron supplementation     INTERVAL HISTORY:  Ms. Charlsie MerlesCorley 43 y.o. female presents today for follow-up.  She reports overall doing well.  She reports mild fatigue.  She denies any heavy menstrual bleeding.  She states on average her menstrual cycle last 5 days with 2 days of heavy bleeding.  She denies any further obvious signs of bleeding.  She denies any lightheadedness or dizziness.  No chest pain or shortness of breath.  She is currently taking p.o. iron daily.  She is here for repeat labs and office visit.   REVIEW OF SYSTEMS:  Review of Systems  Constitutional: Positive for fatigue.  All other systems reviewed and are negative.    PAST MEDICAL/SURGICAL HISTORY:  Past Medical History:  Diagnosis Date  . Hypertension   . Migraine    No past surgical history on file.   SOCIAL HISTORY:  Social History   Socioeconomic History  . Marital status: Divorced    Spouse name: Not on file  . Number of children: Not on file  . Years of education: Not on file  . Highest education level: Not on file  Occupational History  . Not on file  Social Needs  . Financial resource strain: Not on file  . Food insecurity    Worry: Not on file    Inability: Not on file  . Transportation needs    Medical: Not on file    Non-medical: Not on file  Tobacco Use  . Smoking status: Never Smoker  . Smokeless tobacco: Never Used  Substance and Sexual Activity  . Alcohol use: No  . Drug use: No  . Sexual activity: Not Currently  Lifestyle  . Physical activity    Days per week: Not on file    Minutes per session: Not on file  . Stress: Not on file  Relationships  . Social Musicianconnections    Talks on phone: Not on file    Gets together: Not  on file    Attends religious service: Not on file    Active member of club or organization: Not on file    Attends meetings of clubs or organizations: Not on file    Relationship status: Not on file  . Intimate partner violence    Fear of current or ex partner: Not on file    Emotionally abused: Not on file    Physically abused: Not on file    Forced sexual activity: Not on file  Other Topics Concern  . Not on file  Social History Narrative  . Not on file    FAMILY HISTORY:  Family History  Problem Relation Age of Onset  . Hypertension Mother   . Kidney disease Mother        lupus,HTN  . Hypertension Brother   . Heart disease Maternal Grandmother   . Hyperlipidemia Maternal Grandmother   . Hypertension Maternal Grandmother   . Cancer Maternal Grandfather        colon    CURRENT MEDICATIONS:  Outpatient Encounter Medications as of 12/08/2018  Medication Sig  . amLODipine (NORVASC) 10 MG tablet Take 1 tablet (10 mg total) by mouth daily.  . hydrochlorothiazide (HYDRODIURIL) 25 MG tablet Take 1 tablet (25 mg total) by  mouth daily.  Marland Kitchen lisinopril (ZESTRIL) 10 MG tablet TAKE HALF A TABLET (5 MG TOTAL) BY MOUTH EVERY MORNING.  . naproxen (NAPROXEN DR) 500 MG EC tablet TAKE 1 TABLET BY MOUTH EVERY 12 HOURS AS NEEDED FOR CRAMPING - TAKE WITH FOOD  . ALPRAZolam (XANAX) 1 MG tablet Take 1 tablet (1 mg total) by mouth at bedtime as needed. for sleep (Patient not taking: Reported on 10/08/2018)   Facility-Administered Encounter Medications as of 12/08/2018  Medication  . 0.9 %  sodium chloride infusion    ALLERGIES:  Allergies  Allergen Reactions  . Penicillins Swelling and Rash    Has patient had a PCN reaction causing immediate rash, facial/tongue/throat swelling, SOB or lightheadedness with hypotension: no Has patient had a PCN reaction causing severe rash involving mucus membranes or skin necrosis: No Has patient had a PCN reaction that required hospitalization No Has patient  had a PCN reaction occurring within the last 10 years: No If all of the above answers are "NO", then may proceed with Cephalosporin use.      PHYSICAL EXAM:  ECOG Performance status: 1  Vitals:   12/08/18 1330  BP: (!) 139/95  Pulse: 98  Resp: 18  Temp: 98.6 F (37 C)  SpO2: 99%   Filed Weights   12/08/18 1330  Weight: 190 lb 9.6 oz (86.5 kg)    Physical Exam Constitutional:      Appearance: Normal appearance. She is obese.  HENT:     Head: Normocephalic.     Nose: Nose normal.     Mouth/Throat:     Mouth: Mucous membranes are moist.     Pharynx: Oropharynx is clear.  Eyes:     Extraocular Movements: Extraocular movements intact.     Conjunctiva/sclera: Conjunctivae normal.  Neck:     Musculoskeletal: Normal range of motion.  Cardiovascular:     Rate and Rhythm: Normal rate and regular rhythm.     Pulses: Normal pulses.     Heart sounds: Normal heart sounds.  Pulmonary:     Effort: Pulmonary effort is normal.     Breath sounds: Normal breath sounds.  Abdominal:     General: Bowel sounds are normal.     Palpations: Abdomen is soft.  Musculoskeletal: Normal range of motion.  Skin:    General: Skin is warm.  Neurological:     General: No focal deficit present.     Mental Status: She is alert and oriented to person, place, and time.  Psychiatric:        Mood and Affect: Mood normal.        Behavior: Behavior normal.        Thought Content: Thought content normal.        Judgment: Judgment normal.      LABORATORY DATA:  I have reviewed the labs as listed.  CBC    Component Value Date/Time   WBC 5.5 12/07/2018 1204   RBC 5.52 (H) 12/07/2018 1204   HGB 14.0 12/07/2018 1204   HGB 8.4 (L) 09/07/2017 1451   HCT 45.3 12/07/2018 1204   HCT 31.5 (L) 09/07/2017 1451   PLT 269 12/07/2018 1204   PLT 533 (H) 09/07/2017 1451   MCV 82.1 12/07/2018 1204   MCV 58 (L) 09/07/2017 1451   MCH 25.4 (L) 12/07/2018 1204   MCHC 30.9 12/07/2018 1204   RDW 14.3  12/07/2018 1204   RDW 20.7 (H) 09/07/2017 1451   LYMPHSABS 1.8 12/07/2018 1204   LYMPHSABS 2.8 09/07/2017 1451  MONOABS 0.5 12/07/2018 1204   EOSABS 1.1 (H) 12/07/2018 1204   EOSABS 0.1 09/07/2017 1451   BASOSABS 0.0 12/07/2018 1204   BASOSABS 0.0 09/07/2017 1451   CMP Latest Ref Rng & Units 12/07/2018 10/05/2018 04/20/2018  Glucose 70 - 99 mg/dL 846(N102(H) 96 82  BUN 6 - 20 mg/dL 13 13 10   Creatinine 0.44 - 1.00 mg/dL 6.290.81 5.280.80 4.130.88  Sodium 135 - 145 mmol/L 139 138 138  Potassium 3.5 - 5.1 mmol/L 3.7 3.5 4.1  Chloride 98 - 111 mmol/L 105 102 106  CO2 22 - 32 mmol/L 26 24 25   Calcium 8.9 - 10.3 mg/dL 8.9 9.4 2.4(M8.7(L)  Total Protein 6.5 - 8.1 g/dL 7.7 0.1(U8.3(H) 8.1  Total Bilirubin 0.3 - 1.2 mg/dL 0.8 0.4 0.5  Alkaline Phos 38 - 126 U/L 37(L) 34(L) 40  AST 15 - 41 U/L 14(L) 14(L) 13(L)  ALT 0 - 44 U/L 15 12 13           ASSESSMENT & PLAN:   Chronic iron deficiency anemia 1.  Iron deficiency anemia: - She has menorrhagia with 2 days of heavy bleeding.  They are lasting 5 to 7 days every 28 days. -She reportedly was started on oral contraceptive pills 02/2018 and she has been noticing a decrease in her bleeding. - She last received Feraheme on 05/14/2018 and 05/21/2018. - She was started on oral iron tablets daily. - Most recent CBC reveals a hemoglobin of 14, ferritin level of 11, iron 27, TIBC 382 saturation of 7%.  I have instructed the patient to increase p.o. iron to twice a day.  We will reassess her ferritin and iron levels in 8 weeks.  If iron levels remain low would recommend IV Feraheme 510 mg x 2 doses.  2.  Vitamin D deficiency -Most recent vitamin D level was low at 25.  Have recommended patient begin vitamin D supplementation daily.      Orders placed this encounter:  Orders Placed This Encounter  Procedures  . CBC with Differential  . Comprehensive metabolic panel  . Iron and TIBC  . Ferritin  . Vitamin B12  . Vitamin D 25 hydroxy       Dagoberto LigasKim R Caci Orren  Annie  [facility] [phone]

## 2018-12-17 ENCOUNTER — Other Ambulatory Visit: Payer: Self-pay

## 2018-12-20 ENCOUNTER — Ambulatory Visit (INDEPENDENT_AMBULATORY_CARE_PROVIDER_SITE_OTHER): Payer: BC Managed Care – PPO | Admitting: Family Medicine

## 2018-12-20 ENCOUNTER — Other Ambulatory Visit: Payer: Self-pay

## 2018-12-20 DIAGNOSIS — I10 Essential (primary) hypertension: Secondary | ICD-10-CM | POA: Diagnosis not present

## 2018-12-20 DIAGNOSIS — N920 Excessive and frequent menstruation with regular cycle: Secondary | ICD-10-CM | POA: Diagnosis not present

## 2018-12-20 MED ORDER — INDOMETHACIN 25 MG PO CAPS: 25.0000 mg | ORAL_CAPSULE | Freq: Three times a day (TID) | ORAL | 2 refills | Status: DC | PRN

## 2018-12-20 MED ORDER — LISINOPRIL 10 MG PO TABS: ORAL_TABLET | ORAL | 5 refills | Status: DC

## 2018-12-20 NOTE — Progress Notes (Signed)
   Subjective:    Patient ID: April Finley, female    DOB: [birth year], 43 y.o.   MRN: [id number]  Hypertension This is a chronic problem. The current episode started more than 1 year ago. Pertinent negatives include no chest pain or shortness of breath. Treatments tried: norvascn hctz and lisinopril. There are no compliance problems.    Patient states she was wanting a different medication to take for her severe menstrual cramps. Patient states the Lodine doesn't help.  Patient relates that other anti-inflammatories have not helped we talked about other choices we will try indomethacin  15 minutes was spent with patient today discussing healthcare issues which they came.  More than 50% of this visit-total duration of visit-was spent in counseling and coordination of care.  Please see diagnosis regarding the focus of this coordination and care   Virtual Visit via Video Note  I connected with [name] on 12/20/18 at  3:00 PM EDT by a video enabled telemedicine application and verified that I am speaking with the correct person using two identifiers.  Location: Patient: home Provider: office   I discussed the limitations of evaluation and management by telemedicine and the availability of in person appointments. The patient expressed understanding and agreed to proceed.  History of Present Illness:    Observations/Objective:   Assessment and Plan:   Follow Up Instructions:    I discussed the assessment and treatment plan with the patient. The patient was provided an opportunity to ask questions and all were answered. The patient agreed with the plan and demonstrated an understanding of the instructions.   The patient was advised to call back or seek an in-person evaluation if the symptoms worsen or if the condition fails to improve as anticipated.  I provided 15 minutes of non-face-to-face time during this encounter.      Review of Systems  Constitutional: Negative for  activity change, appetite change and fatigue.  HENT: Negative for congestion and rhinorrhea.   Respiratory: Negative for cough and shortness of breath.   Cardiovascular: Negative for chest pain and leg swelling.  Gastrointestinal: Negative for abdominal pain and diarrhea.  Endocrine: Negative for polydipsia and polyphagia.  Genitourinary: Positive for menstrual problem. Negative for dysuria, enuresis and frequency.  Skin: Negative for color change.  Neurological: Negative for dizziness and weakness.  Psychiatric/Behavioral: Negative for behavioral problems and confusion.       Objective:   Physical Exam  Patient had virtual visit Appears to be in no distress Atraumatic Neuro able to relate and oriented No apparent resp distress Color normal     Assessment & Plan:  Menorrhagia patient will try indomethacin to see if this helps we will stop the other anti-inflammatory.  If she has ongoing troubles I recommend consultation with GYN  Patient is due for a female checkup I encourage her to set this up with her OB/GYN  Blood pressure related issues refills of medications given patient states her blood pressure under good control

## 2018-12-27 ENCOUNTER — Other Ambulatory Visit: Payer: Self-pay | Admitting: Family Medicine

## 2019-01-26 ENCOUNTER — Inpatient Hospital Stay (HOSPITAL_COMMUNITY): Payer: BC Managed Care – PPO | Attending: Hematology

## 2019-02-02 ENCOUNTER — Ambulatory Visit (HOSPITAL_COMMUNITY): Payer: BC Managed Care – PPO | Admitting: Hematology

## 2019-02-04 ENCOUNTER — Other Ambulatory Visit: Payer: Self-pay | Admitting: Nurse Practitioner

## 2019-03-30 ENCOUNTER — Encounter: Payer: Self-pay | Admitting: Family Medicine

## 2019-04-04 ENCOUNTER — Telehealth: Payer: Self-pay | Admitting: Family Medicine

## 2019-04-04 MED ORDER — HYDRALAZINE HCL 25 MG PO TABS: ORAL_TABLET | ORAL | 2 refills | Status: DC

## 2019-04-04 NOTE — Telephone Encounter (Signed)
Pt is currently not having any swelling at this time. Pt was placed on Prednisone and Clindamycin. Pt states after she began the meds prescribed by Urgent Care the swelling has went down. No trouble breathing, no shortness of breath. Pt has not taking any medications at this time. Please advise. Thank you

## 2019-04-04 NOTE — Telephone Encounter (Signed)
Pt is currently taking all medication Amlodipine, HCTZ , lisinopril daily. Pt is also taking Indomethacin. Pt was placed on 5 mg Prednisone and 300 mg Clindamycin TID. Pt was also instructed to take antihistamine so patient is taking an off brand 10 mg Claritin. Please advise. Thank you

## 2019-04-04 NOTE — Telephone Encounter (Signed)
I am fine with it being either way the patient can decide If she would like for [place] to check the blood pressure then obviously in office Which from my point of view may well be the best way

## 2019-04-04 NOTE — Telephone Encounter (Signed)
Pt contacted and verbalized understanding. Hydralazine sent to [facility] . Pt informed to bring BP cuff with her to to office visit .

## 2019-04-04 NOTE — Telephone Encounter (Signed)
Nurses please confirm with the patient her regimen Med list currently says amlodipine, hydrochlorothiazide, lisinopril Is she taking all 3 currently? Also indomethacin is she currently taking it? We will need to make adjustments in her medicine but I need these answers first preferably today

## 2019-04-04 NOTE — Telephone Encounter (Signed)
Pt is on BP medication and it has caused some swelling in her lip. She sent a mychart message last week in regards to that and was advised urgent care. She went to urgent care and was told to follow up with PCP because the BP medicine she was on is causing the swelling. She has a virtual appt tomorrow with Dr. Nicki Reaper. She took her BP yesterday and bottom number was 111. She called the nurse line and was told to take her BP medicine yesterday which she did and her BP came back down but she is worried the swelling will start again if she takes medicine today. Should she take medicine today or wait until appt tomorrow. She hasnt checked her BP this morning yet.

## 2019-04-04 NOTE — Telephone Encounter (Signed)
Follow up scheduled for tomorrow is virtual; should this be in office? Please advise. Thank you

## 2019-04-04 NOTE — Telephone Encounter (Signed)
It is most likely related to the lisinopril Stop lisinopril  Start hydralazine 25 mg, 1 taken 3 times daily, #90, 2 refills Please keep follow-up visit If the patient has a blood pressure cuff at home she should bring this with her

## 2019-04-05 ENCOUNTER — Ambulatory Visit: Payer: BC Managed Care – PPO | Admitting: Family Medicine

## 2019-04-05 ENCOUNTER — Other Ambulatory Visit: Payer: Self-pay

## 2019-04-05 VITALS — BP 136/84 | Temp 97.0°F | Wt 194.2 lb

## 2019-04-05 DIAGNOSIS — I10 Essential (primary) hypertension: Secondary | ICD-10-CM

## 2019-04-05 MED ORDER — POTASSIUM CHLORIDE ER 10 MEQ PO TBCR: 10.0000 meq | EXTENDED_RELEASE_TABLET | Freq: Every day | ORAL | 5 refills | Status: DC

## 2019-04-05 NOTE — Progress Notes (Signed)
   Subjective:    Patient ID: April Finley, female    DOB: [birth year], 43 y.o.   MRN: [id number]  HPI  Patient arrives for a follow up from recent urgent care visit for angioedema. Patient states they are unsure if it was food or medication related but she has held some of her meds since visit. She had angioedema more than likely associated with the lisinopril She denies any wheezing difficulty breathing or difficulty swallowing She was placed on clindamycin for a abscessed tooth as well as prednisone for the angioedema She is finished up the prednisone no need to take it any further She is finishing up the clindamycin will see a dentist She does have a history of high blood pressure Review of Systems  Constitutional: Negative for activity change, appetite change and fatigue.  HENT: Negative for congestion and rhinorrhea.   Respiratory: Negative for cough and shortness of breath.   Cardiovascular: Negative for chest pain and leg swelling.  Gastrointestinal: Negative for abdominal pain and diarrhea.  Endocrine: Negative for polydipsia and polyphagia.  Skin: Negative for color change.  Neurological: Negative for dizziness and weakness.  Psychiatric/Behavioral: Negative for behavioral problems and confusion.       Objective:   Physical Exam Vitals signs reviewed.  Constitutional:      General: She is not in acute distress. HENT:     Head: Normocephalic and atraumatic.  Eyes:     General:        Right eye: No discharge.        Left eye: No discharge.  Neck:     Trachea: No tracheal deviation.  Cardiovascular:     Rate and Rhythm: Normal rate and regular rhythm.     Heart sounds: Normal heart sounds. No murmur.  Pulmonary:     Effort: Pulmonary effort is normal. No respiratory distress.     Breath sounds: Normal breath sounds.  Lymphadenopathy:     Cervical: No cervical adenopathy.  Skin:    General: Skin is warm and dry.  Neurological:     Mental Status: She is alert.    Coordination: Coordination normal.  Psychiatric:        Behavior: Behavior normal.           Assessment & Plan:  Her blood pressure cuff overestimates her blood pressure When I checked it blood pressure actually looks much better I got a 132/86  I would encourage the patient to continue the HCTZ in addition to this and the hydralazine twice daily  Stay away from lisinopril that is what caused the angioedema I doubt the amlodipine did but she will stay away from that as well  Low-salt diet regular physical activity recommended  Patient will send [place] a MyChart message in the near future regarding her blood pressures

## 2019-04-05 NOTE — Patient Instructions (Signed)

## 2019-04-08 ENCOUNTER — Encounter: Payer: Self-pay | Admitting: Family Medicine

## 2019-04-11 ENCOUNTER — Encounter: Payer: Self-pay | Admitting: Family Medicine

## 2019-04-13 ENCOUNTER — Other Ambulatory Visit: Payer: Self-pay | Admitting: *Deleted

## 2019-04-13 ENCOUNTER — Encounter: Payer: Self-pay | Admitting: *Deleted

## 2019-04-13 NOTE — Telephone Encounter (Signed)
Nurses I would not recommend OTC potassium instead I would recommend the patient consider switching her 10 mEq potassium capsule to liquid potassium 10 mEq daily  Please go ahead with this switch and communicate to the patient

## 2019-04-13 NOTE — Telephone Encounter (Signed)
Could not find liquid potassium in epic so I called the pharm and gave a verbal for the rx.

## 2019-05-16 ENCOUNTER — Encounter: Payer: Self-pay | Admitting: Family Medicine

## 2019-05-17 NOTE — Telephone Encounter (Signed)
Nurses It is unlikely that her blood pressure medicine is causing a cough based on the medication she is on  If a person is having coughing for no obvious reason is it is still reasonable to consider testing for Covid Please communicate with patient how she can go about doing that Certainly if she would like to do a virtual visit as well this can be done

## 2019-06-28 ENCOUNTER — Other Ambulatory Visit: Payer: Self-pay | Admitting: Family Medicine

## 2019-07-05 ENCOUNTER — Other Ambulatory Visit: Payer: Self-pay | Admitting: *Deleted

## 2019-07-05 ENCOUNTER — Encounter: Payer: Self-pay | Admitting: Family Medicine

## 2019-07-05 DIAGNOSIS — E559 Vitamin D deficiency, unspecified: Secondary | ICD-10-CM

## 2019-07-05 DIAGNOSIS — E538 Deficiency of other specified B group vitamins: Secondary | ICD-10-CM

## 2019-07-05 DIAGNOSIS — Z13220 Encounter for screening for lipoid disorders: Secondary | ICD-10-CM

## 2019-07-05 DIAGNOSIS — Z79899 Other long term (current) drug therapy: Secondary | ICD-10-CM

## 2019-07-05 DIAGNOSIS — D509 Iron deficiency anemia, unspecified: Secondary | ICD-10-CM

## 2019-07-05 NOTE — Telephone Encounter (Signed)
I would recommend the following lab work  CBC, ferritin, TIBC, B12, lipid, vitamin D, metabolic 7  Diagnosis iron deficient anemia, low B12, vitamin D deficiency, diuretic use, lipid screening  Please order the test let patient know that we are checking some additional labs as well

## 2019-07-07 LAB — LIPID PANEL
Chol/HDL Ratio: 2.9 ratio (ref 0.0–4.4)
Cholesterol, Total: 141 mg/dL (ref 100–199)
HDL: 49 mg/dL (ref >39)
LDL Chol Calc (NIH): 79 mg/dL (ref 0–99)
Triglycerides: 65 mg/dL (ref 0–149)
VLDL Cholesterol Cal: 13 mg/dL (ref 5–40)

## 2019-07-07 LAB — CBC WITH DIFFERENTIAL/PLATELET
Basophils Absolute: 0 10*3/uL (ref 0.0–0.2)
Basos: 1 %
EOS (ABSOLUTE): 0.2 10*3/uL (ref 0.0–0.4)
Eos: 5 %
Hematocrit: 40.7 % (ref 34.0–46.6)
Hemoglobin: 12.2 g/dL (ref 11.1–15.9)
Immature Grans (Abs): 0 10*3/uL (ref 0.0–0.1)
Immature Granulocytes: 0 %
Lymphocytes Absolute: 1.3 10*3/uL (ref 0.7–3.1)
Lymphs: 30 %
MCH: 21.9 pg — ABNORMAL LOW (ref 26.6–33.0)
MCHC: 30 g/dL — ABNORMAL LOW (ref 31.5–35.7)
MCV: 73 fL — ABNORMAL LOW (ref 79–97)
Monocytes Absolute: 0.6 10*3/uL (ref 0.1–0.9)
Monocytes: 13 %
Neutrophils Absolute: 2.2 10*3/uL (ref 1.4–7.0)
Neutrophils: 51 %
Platelets: 356 10*3/uL (ref 150–450)
RBC: 5.56 x10E6/uL — ABNORMAL HIGH (ref 3.77–5.28)
RDW: 14.8 % (ref 11.7–15.4)
WBC: 4.3 10*3/uL (ref 3.4–10.8)

## 2019-07-07 LAB — IRON AND TIBC
Iron Saturation: 4 % — CL (ref 15–55)
Iron: 13 ug/dL — ABNORMAL LOW (ref 27–159)
Total Iron Binding Capacity: 354 ug/dL (ref 250–450)
UIBC: 341 ug/dL (ref 131–425)

## 2019-07-07 LAB — FERRITIN: Ferritin: 7 ng/mL — ABNORMAL LOW (ref 15–150)

## 2019-07-07 LAB — BASIC METABOLIC PANEL
BUN/Creatinine Ratio: 7 — ABNORMAL LOW (ref 9–23)
BUN: 5 mg/dL — ABNORMAL LOW (ref 6–24)
CO2: 22 mmol/L (ref 20–29)
Calcium: 8.8 mg/dL (ref 8.7–10.2)
Chloride: 105 mmol/L (ref 96–106)
Creatinine, Ser: 0.68 mg/dL (ref 0.57–1.00)
GFR calc Af Amer: 126 mL/min/{1.73_m2} (ref >59)
GFR calc non Af Amer: 109 mL/min/{1.73_m2} (ref >59)
Glucose: 83 mg/dL (ref 65–99)
Potassium: 3.8 mmol/L (ref 3.5–5.2)
Sodium: 140 mmol/L (ref 134–144)

## 2019-07-07 LAB — VITAMIN B12: Vitamin B-12: 366 pg/mL (ref 232–1245)

## 2019-07-07 LAB — VITAMIN D 25 HYDROXY (VIT D DEFICIENCY, FRACTURES): Vit D, 25-Hydroxy: 10.1 ng/mL — ABNORMAL LOW (ref 30.0–100.0)

## 2019-07-08 ENCOUNTER — Encounter (HOSPITAL_COMMUNITY): Payer: Self-pay | Admitting: Nurse Practitioner

## 2019-07-09 ENCOUNTER — Encounter: Payer: Self-pay | Admitting: Family Medicine

## 2019-07-12 ENCOUNTER — Other Ambulatory Visit (HOSPITAL_COMMUNITY): Payer: Self-pay | Admitting: Nurse Practitioner

## 2019-07-21 ENCOUNTER — Inpatient Hospital Stay (HOSPITAL_COMMUNITY): Payer: BC Managed Care – PPO | Attending: Nurse Practitioner | Admitting: Nurse Practitioner

## 2019-07-21 ENCOUNTER — Other Ambulatory Visit: Payer: Self-pay

## 2019-07-21 VITALS — BP 143/106 | HR 89 | Temp 97.1°F | Resp 18 | Wt 188.8 lb

## 2019-07-21 DIAGNOSIS — E559 Vitamin D deficiency, unspecified: Secondary | ICD-10-CM | POA: Insufficient documentation

## 2019-07-21 DIAGNOSIS — Z79899 Other long term (current) drug therapy: Secondary | ICD-10-CM | POA: Diagnosis not present

## 2019-07-21 DIAGNOSIS — D509 Iron deficiency anemia, unspecified: Secondary | ICD-10-CM

## 2019-07-21 DIAGNOSIS — N920 Excessive and frequent menstruation with regular cycle: Secondary | ICD-10-CM | POA: Insufficient documentation

## 2019-07-21 MED ORDER — ERGOCALCIFEROL 1.25 MG (50000 UT) PO CAPS: 50000.0000 [IU] | ORAL_CAPSULE | ORAL | 3 refills | Status: DC

## 2019-07-21 NOTE — Progress Notes (Signed)
[place], [place] [address]   CLINIC:  Medical Oncology/Hematology  PCP:  Kathyrn Drown, MD [address] [place] [address] [phone]   REASON FOR VISIT: Follow-up for iron deficiency anemia  CURRENT THERAPY: Intermittent iron infusions   INTERVAL HISTORY:  April Finley 44 y.o. female returns for routine follow-up for iron deficiency anemia.  Patient reports she has tried to take the oral iron tablets however they cause her severe constipation.  She has tried taking them off and on several times.  Patient denies any bright red bleeding per rectum or melena.  She denies any easy bruising or bleeding. Denies any nausea, vomiting, or diarrhea. Denies any new pains. Had not noticed any recent bleeding such as epistaxis, hematuria or hematochezia. Denies recent chest pain on exertion, shortness of breath on minimal exertion, pre-syncopal episodes, or palpitations. Denies any numbness or tingling in hands or feet. Denies any recent fevers, infections, or recent hospitalizations. Patient reports appetite at 75% and energy level at 50%.  She is eating well maintain her weight at this time.     REVIEW OF SYSTEMS:  Review of Systems  All other systems reviewed and are negative.    PAST MEDICAL/SURGICAL HISTORY:  Past Medical History:  Diagnosis Date  . Hypertension   . Migraine    No past surgical history on file.   SOCIAL HISTORY:  Social History   Socioeconomic History  . Marital status: Divorced    Spouse name: Not on file  . Number of children: Not on file  . Years of education: Not on file  . Highest education level: Not on file  Occupational History  . Not on file  Tobacco Use  . Smoking status: Never Smoker  . Smokeless tobacco: Never Used  Substance and Sexual Activity  . Alcohol use: No  . Drug use: No  . Sexual activity: Not Currently  Other Topics Concern  . Not on file  Social History Narrative  .  Not on file   Social Determinants of Health   Financial Resource Strain:   . Difficulty of Paying Living Expenses:   Food Insecurity:   . Worried About Charity fundraiser in the Last Year:   . Arboriculturist in the Last Year:   Transportation Needs:   . Film/video editor (Medical):   Marland Kitchen Lack of Transportation (Non-Medical):   Physical Activity:   . Days of Exercise per Week:   . Minutes of Exercise per Session:   Stress:   . Feeling of Stress :   Social Connections:   . Frequency of Communication with Friends and Family:   . Frequency of Social Gatherings with Friends and Family:   . Attends Religious Services:   . Active Member of Clubs or Organizations:   . Attends Archivist Meetings:   Marland Kitchen Marital Status:   Intimate Partner Violence:   . Fear of Current or Ex-Partner:   . Emotionally Abused:   Marland Kitchen Physically Abused:   . Sexually Abused:     FAMILY HISTORY:  Family History  Problem Relation Age of Onset  . Hypertension Mother   . Kidney disease Mother        lupus,HTN  . Hypertension Brother   . Heart disease Maternal Grandmother   . Hyperlipidemia Maternal Grandmother   . Hypertension Maternal Grandmother   . Cancer Maternal Grandfather        colon  CURRENT MEDICATIONS:  Outpatient Encounter Medications as of 07/21/2019  Medication Sig  . hydrALAZINE (APRESOLINE) 25 MG tablet TAKE 1 TABLET BY MOUTH THREE TIMES A DAY  . hydrochlorothiazide (HYDRODIURIL) 25 MG tablet Take 1 tablet (25 mg total) by mouth daily.  . potassium chloride 20 MEQ/15ML (10%) SOLN daily. 7.8mL QD  . [DISCONTINUED] PRESCRIPTION MEDICATION Liquid potassium 10 meq once daily. Prescribed by dr Lorin Picket on 04/13/19. Pt unable to swallow potassium pill.  . ergocalciferol (VITAMIN D2) 1.25 MG (50000 UT) capsule Take 1 capsule (50,000 Units total) by mouth once a week.  . indomethacin (INDOCIN) 25 MG capsule Take 1 capsule (25 mg total) by mouth 3 (three) times daily as needed (menstral  cramps). (Patient not taking: Reported on 07/21/2019)   Facility-Administered Encounter Medications as of 07/21/2019  Medication  . 0.9 %  sodium chloride infusion    ALLERGIES:  Allergies  Allergen Reactions  . Penicillins Swelling and Rash    Has patient had a PCN reaction causing immediate rash, facial/tongue/throat swelling, SOB or lightheadedness with hypotension: no Has patient had a PCN reaction causing severe rash involving mucus membranes or skin necrosis: No Has patient had a PCN reaction that required hospitalization No Has patient had a PCN reaction occurring within the last 10 years: No If all of the above answers are "NO", then may proceed with Cephalosporin use.   Marland Kitchen Lisinopril     Had angioedema (was also on amlodipine at the time)     PHYSICAL EXAM:  ECOG Performance status: 1  Vitals:   07/21/19 0918  BP: (!) 143/106  Pulse: 89  Resp: 18  Temp: (!) 97.1 F (36.2 C)  SpO2: 100%   Filed Weights   07/21/19 0918  Weight: 188 lb 12.8 oz (85.6 kg)    Physical Exam Constitutional:      Appearance: Normal appearance. She is normal weight.  Musculoskeletal:        General: Normal range of motion.  Skin:    General: Skin is warm.  Neurological:     Mental Status: She is alert and oriented to person, place, and time. Mental status is at baseline.  Psychiatric:        Mood and Affect: Mood normal.        Behavior: Behavior normal.        Thought Content: Thought content normal.        Judgment: Judgment normal.      LABORATORY DATA:  I have reviewed the labs as listed.  CBC    Component Value Date/Time   WBC 4.3 07/06/2019 0841   WBC 5.5 12/07/2018 1204   RBC 5.56 (H) 07/06/2019 0841   RBC 5.52 (H) 12/07/2018 1204   HGB 12.2 07/06/2019 0841   HCT 40.7 07/06/2019 0841   PLT 356 07/06/2019 0841   MCV 73 (L) 07/06/2019 0841   MCH 21.9 (L) 07/06/2019 0841   MCH 25.4 (L) 12/07/2018 1204   MCHC 30.0 (L) 07/06/2019 0841   MCHC 30.9 12/07/2018 1204     RDW 14.8 07/06/2019 0841   LYMPHSABS 1.3 07/06/2019 0841   MONOABS 0.5 12/07/2018 1204   EOSABS 0.2 07/06/2019 0841   BASOSABS 0.0 07/06/2019 0841   CMP Latest Ref Rng & Units 07/06/2019 12/07/2018 10/05/2018  Glucose 65 - 99 mg/dL 83 409(W) 96  BUN 6 - 24 mg/dL 5(L) 13 13  Creatinine 0.57 - 1.00 mg/dL 1.19 1.47 8.29  Sodium 134 - 144 mmol/L 140 139 138  Potassium 3.5 - 5.2  mmol/L 3.8 3.7 3.5  Chloride 96 - 106 mmol/L 105 105 102  CO2 20 - 29 mmol/L 22 26 24   Calcium 8.7 - 10.2 mg/dL 8.8 8.9 9.4  Total Protein 6.5 - 8.1 g/dL - 7.7 )  Total Bilirubin 0.3 - 1.2 mg/dL - 0.8 0.4  Alkaline Phos 38 - 126 U/L - 37(L) 34(L)  AST 15 - 41 U/L - 14(L) 14(L)  ALT 0 - 44 U/L - 15 12      I personally performed a face-to-face visit.  All questions were answered to patient's stated satisfaction. Encouraged patient to call with any new concerns or questions before his next visit to the cancer center and we can certain see him sooner, if needed.      ASSESSMENT & PLAN:   Chronic iron deficiency anemia 1.  Iron deficiency anemia: - She has menorrhagia with 2 days of heavy bleeding.  They are lasting 5 to 7 days every 28 days. -She reportedly was started on oral contraceptive pills 02/2018 and she has been noticing a decrease in her bleeding. - She last received Feraheme on 05/14/2018 and 05/21/2018. - She also was started on oral iron tablets daily. -Labs on 07/06/2019 showed her hemoglobin 12.2, ferritin 7, percent saturation 4, platelets 356.  Her vitamin B12 level is 366 -She reports she stopped taking the oral iron tablets for a while due to constipation. -We will take her off of the oral iron tablets and try IV iron x2. - She will follow-up with 07/08/2019 in 4 months with repeat labs.  2.  Vitamin D deficiency: -Labs done on 07/06/2019 showed vitamin D level 10.1 -We will order vitamin D 50,000 units weekly.      Orders placed this encounter:  Orders Placed This Encounter  Procedures   . Lactate dehydrogenase  . CBC with Differential/Platelet  . Comprehensive metabolic panel  . Ferritin  . Iron and TIBC  . Vitamin B12  . VITAMIN D 25 Hydroxy (Vit-D Deficiency, Fractures)  . Folate      07/08/2019, FNP-C [facility] [phone]

## 2019-07-21 NOTE — Patient Instructions (Signed)
[facility] at [facility] Discharge Instructions     Thank you for choosing [facility] at [facility] to provide your oncology and hematology care.  To afford each patient quality time with our provider, please arrive at least 15 minutes before your scheduled appointment time.   If you have a lab appointment with the Cancer Center please come in thru the Main Entrance and check in at the main information desk.  You need to re-schedule your appointment should you arrive 10 or more minutes late.  We strive to give you quality time with our providers, and arriving late affects you and other patients whose appointments are after yours.  Also, if you no show three or more times for appointments you may be dismissed from the clinic at the providers discretion.     Again, thank you for choosing [facility].  Our hope is that these requests will decrease the amount of time that you wait before being seen by our physicians.       _____________________________________________________________  Should you have questions after your visit to [facility], please contact our office at [phone] between the hours of 8:00 a.m. and 4:30 p.m.  Voicemails left after 4:00 p.m. will not be returned until the following business day.  For prescription refill requests, have your pharmacy contact our office and allow 72 hours.    Due to Covid, you will need to wear a mask upon entering the hospital. If you do not have a mask, a mask will be given to you at the Main Entrance upon arrival. For doctor visits, patients may have 1 support person with them. For treatment visits, patients can not have anyone with them due to social distancing guidelines and our immunocompromised population.      

## 2019-07-21 NOTE — Assessment & Plan Note (Addendum)
1.  Iron deficiency anemia: - She has menorrhagia with 2 days of heavy bleeding.  They are lasting 5 to 7 days every 28 days. -She reportedly was started on oral contraceptive pills 02/2018 and she has been noticing a decrease in her bleeding. - She last received Feraheme on 05/14/2018 and 05/21/2018. - She also was started on oral iron tablets daily. -Labs on 07/06/2019 showed her hemoglobin 12.2, ferritin 7, percent saturation 4, platelets 356.  Her vitamin B12 level is 366 -She reports she stopped taking the oral iron tablets for a while due to constipation. -We will take her off of the oral iron tablets and try IV iron x2. - She will follow-up with [place] in 4 months with repeat labs.  2.  Vitamin D deficiency: -Labs done on 07/06/2019 showed vitamin D level 10.1 -We will order vitamin D 50,000 units weekly.

## 2019-07-29 ENCOUNTER — Inpatient Hospital Stay (HOSPITAL_COMMUNITY): Payer: BC Managed Care – PPO

## 2019-07-29 ENCOUNTER — Other Ambulatory Visit: Payer: Self-pay

## 2019-07-29 VITALS — BP 128/90 | HR 83 | Temp 97.6°F | Resp 16

## 2019-07-29 DIAGNOSIS — D509 Iron deficiency anemia, unspecified: Secondary | ICD-10-CM | POA: Diagnosis not present

## 2019-07-29 MED ORDER — SODIUM CHLORIDE 0.9 % IV SOLN: INTRAVENOUS | Status: DC

## 2019-07-29 MED ORDER — SODIUM CHLORIDE 0.9 % IV SOLN
510.0000 mg | Freq: Once | INTRAVENOUS | Status: AC
  Administered 2019-07-29: 510 mg via INTRAVENOUS
  Filled 2019-07-29: qty 510

## 2019-07-29 NOTE — Progress Notes (Signed)
Iron given per orders. Patient tolerated it well without problems. Vitals stable and discharged home from clinic ambulatory. Follow up as scheduled.  

## 2019-07-29 NOTE — Patient Instructions (Signed)
[facility]  Discharge Instructions:   _______________________________________________________________  Thank you for choosing [facility] at [facility] to provide your oncology and hematology care.  To afford each patient quality time with our providers, please arrive at least 15 minutes before your scheduled appointment.  You need to re-schedule your appointment if you arrive 10 or more minutes late.  We strive to give you quality time with our providers, and arriving late affects you and other patients whose appointments are after yours.  Also, if you no show three or more times for appointments you may be dismissed from the clinic.  Again, thank you for choosing [facility] at [facility]. Our hope is that these requests will allow you access to exceptional care and in a timely manner. _______________________________________________________________  If you have questions after your visit, please contact our office at [phone] between the hours of 8:30 a.m. and 5:00 p.m. Voicemails left after 4:30 p.m. will not be returned until the following business day. _______________________________________________________________  For prescription refill requests, have your pharmacy contact our office. _______________________________________________________________  Recommendations made by the consultant and any test results will be sent to your referring physician. _______________________________________________________________ 

## 2019-08-05 ENCOUNTER — Other Ambulatory Visit: Payer: Self-pay

## 2019-08-05 ENCOUNTER — Inpatient Hospital Stay (HOSPITAL_COMMUNITY): Payer: BC Managed Care – PPO

## 2019-08-05 ENCOUNTER — Encounter (HOSPITAL_COMMUNITY): Payer: Self-pay

## 2019-08-05 VITALS — BP 141/101 | HR 81 | Temp 97.5°F | Resp 18

## 2019-08-05 DIAGNOSIS — D509 Iron deficiency anemia, unspecified: Secondary | ICD-10-CM

## 2019-08-05 MED ORDER — SODIUM CHLORIDE 0.9 % IV SOLN: Freq: Once | INTRAVENOUS | Status: AC

## 2019-08-05 MED ORDER — SODIUM CHLORIDE 0.9 % IV SOLN
510.0000 mg | Freq: Once | INTRAVENOUS | Status: AC
  Administered 2019-08-05: 510 mg via INTRAVENOUS
  Filled 2019-08-05: qty 510

## 2019-08-05 NOTE — Patient Instructions (Signed)
[facility] Discharge Instructions  Received Feraheme infusion today. Follow-up as scheduled. Call clinic for any questions or concerns   Thank you for choosing [facility] at [facility] to provide your oncology and hematology care.  To afford each patient quality time with our provider, please arrive at least 15 minutes before your scheduled appointment time.   If you have a lab appointment with the Cancer Center please come in thru the Main Entrance and check in at the main information desk.  You need to re-schedule your appointment should you arrive 10 or more minutes late.  We strive to give you quality time with our providers, and arriving late affects you and other patients whose appointments are after yours.  Also, if you no show three or more times for appointments you may be dismissed from the clinic at the providers discretion.     Again, thank you for choosing [facility].  Our hope is that these requests will decrease the amount of time that you wait before being seen by our physicians.       _____________________________________________________________  Should you have questions after your visit to [facility], please contact our office at [phone] between the hours of 8:00 a.m. and 4:30 p.m.  Voicemails left after 4:00 p.m. will not be returned until the following business day.  For prescription refill requests, have your pharmacy contact our office and allow 72 hours.    Due to Covid, you will need to wear a mask upon entering the hospital. If you do not have a mask, a mask will be given to you at the Main Entrance upon arrival. For doctor visits, patients may have 1 support person with them. For treatment visits, patients can not have anyone with them due to social distancing guidelines and our immunocompromised population.     

## 2019-08-05 NOTE — Progress Notes (Signed)
April Finley tolerated Feraheme infusion well without complaints or incident. Peripheral IV site checked with positive blood return noted prior to and after infusion. VSS upon discharge. Pt discharged self ambulatory in satisfactory condition

## 2019-08-14 ENCOUNTER — Other Ambulatory Visit: Payer: Self-pay | Admitting: Family Medicine

## 2019-08-15 NOTE — Telephone Encounter (Signed)
Unable to lvm.. box full

## 2019-08-15 NOTE — Telephone Encounter (Signed)
90-day needs follow-up visit this may June or July

## 2019-08-16 NOTE — Telephone Encounter (Signed)
Scheduled 6/8

## 2019-08-23 ENCOUNTER — Ambulatory Visit (INDEPENDENT_AMBULATORY_CARE_PROVIDER_SITE_OTHER): Payer: BC Managed Care – PPO | Admitting: Family Medicine

## 2019-08-23 ENCOUNTER — Other Ambulatory Visit: Payer: Self-pay

## 2019-08-23 VITALS — BP 132/98 | Temp 97.5°F | Wt 184.4 lb

## 2019-08-23 DIAGNOSIS — I10 Essential (primary) hypertension: Secondary | ICD-10-CM | POA: Diagnosis not present

## 2019-08-23 DIAGNOSIS — D509 Iron deficiency anemia, unspecified: Secondary | ICD-10-CM

## 2019-08-23 MED ORDER — TRIAMTERENE-HCTZ 37.5-25 MG PO CAPS: 1.0000 | ORAL_CAPSULE | Freq: Every day | ORAL | 5 refills | Status: DC

## 2019-08-23 MED ORDER — INDOMETHACIN 25 MG PO CAPS: 25.0000 mg | ORAL_CAPSULE | Freq: Three times a day (TID) | ORAL | 2 refills | Status: DC | PRN

## 2019-08-23 NOTE — Progress Notes (Signed)
   Subjective:    Patient ID: April Finley, female    DOB: November 12, 1975, 44 y.o.   MRN: [id number]  HPI Patient comes in today with complaints of dizziness and fatigue.  Patient relates this to lower blood pressure readings.  Takes hydralazine 6 and 6pm Takes hctz in the am Moderate frontal headaches 2 AM you had thromboembolic number No nausea Patient also requesting refill for Indomethacin, states she has been taking a prescribed naproxen.   Review of Systems  Constitutional: Negative for activity change, appetite change and fatigue.  HENT: Negative for congestion and rhinorrhea.   Respiratory: Negative for cough and shortness of breath.   Cardiovascular: Negative for chest pain and leg swelling.  Gastrointestinal: Negative for abdominal pain and diarrhea.  Endocrine: Negative for polydipsia and polyphagia.  Skin: Negative for color change.  Neurological: Negative for dizziness and weakness.  Psychiatric/Behavioral: Negative for behavioral problems and confusion.       Objective:   Physical Exam Vitals reviewed.  Constitutional:      General: She is not in acute distress. HENT:     Head: Normocephalic and atraumatic.  Eyes:     General:        Right eye: No discharge.        Left eye: No discharge.  Neck:     Trachea: No tracheal deviation.  Cardiovascular:     Rate and Rhythm: Normal rate and regular rhythm.     Heart sounds: Normal heart sounds. No murmur.  Pulmonary:     Effort: Pulmonary effort is normal. No respiratory distress.     Breath sounds: Normal breath sounds.  Lymphadenopathy:     Cervical: No cervical adenopathy.  Skin:    General: Skin is warm and dry.  Neurological:     Mental Status: She is alert.     Coordination: Coordination normal.  Psychiatric:        Behavior: Behavior normal.   pedal edema   Blood pressure checked with large cuff sitting position in chair feet on floor      Assessment & Plan:  1. HTN (hypertension),  benign Blood pressure elevated.  Higher than what we would like for it to be.  Watch salt diet.  Patient has significant pedal edema.  Switch away from HCTZ.  Recommend Dyazide 1 daily.  Also recommend for the patient to minimize anti-inflammatory use this could contribute to her iron deficiency as well as renal issues Patient will follow up in approximately 2 to 3 weeks she will bring her blood pressure cuff with her  2. Chronic iron deficiency anemia Followed by specialty

## 2019-08-23 NOTE — Patient Instructions (Signed)
DASH Eating Plan DASH stands for "Dietary Approaches to Stop Hypertension." The DASH eating plan is a healthy eating plan that has been shown to reduce high blood pressure (hypertension). It may also reduce your risk for type 2 diabetes, heart disease, and stroke. The DASH eating plan may also help with weight loss. What are tips for following this plan?  General guidelines  Avoid eating more than 2,300 mg (milligrams) of salt (sodium) a day. If you have hypertension, you may need to reduce your sodium intake to 1,500 mg a day.  Limit alcohol intake to no more than 1 drink a day for nonpregnant women and 2 drinks a day for men. One drink equals 12 oz of beer, 5 oz of wine, or 1 oz of hard liquor.  Work with your health care provider to maintain a healthy body weight or to lose weight. Ask what an ideal weight is for you.  Get at least 30 minutes of exercise that causes your heart to beat faster (aerobic exercise) most days of the week. Activities may include walking, swimming, or biking.  Work with your health care provider or diet and nutrition specialist (dietitian) to adjust your eating plan to your individual calorie needs. Reading food labels   Check food labels for the amount of sodium per serving. Choose foods with less than 5 percent of the Daily Value of sodium. Generally, foods with less than 300 mg of sodium per serving fit into this eating plan.  To find whole grains, look for the word "whole" as the first word in the ingredient list. Shopping  Buy products labeled as "low-sodium" or "no salt added."  Buy fresh foods. Avoid canned foods and premade or frozen meals. Cooking  Avoid adding salt when cooking. Use salt-free seasonings or herbs instead of table salt or sea salt. Check with your health care provider or pharmacist before using salt substitutes.  Do not fry foods. Cook foods using healthy methods such as baking, boiling, grilling, and broiling instead.  Cook with  heart-healthy oils, such as olive, canola, soybean, or sunflower oil. Meal planning  Eat a balanced diet that includes: ? 5 or more servings of fruits and vegetables each day. At each meal, try to fill half of your plate with fruits and vegetables. ? Up to 6-8 servings of whole grains each day. ? Less than 6 oz of lean meat, poultry, or fish each day. A 3-oz serving of meat is about the same size as a deck of cards. One egg equals 1 oz. ? 2 servings of low-fat dairy each day. ? A serving of nuts, seeds, or beans 5 times each week. ? Heart-healthy fats. Healthy fats called Omega-3 fatty acids are found in foods such as flaxseeds and coldwater fish, like sardines, salmon, and mackerel.  Limit how much you eat of the following: ? Canned or prepackaged foods. ? Food that is high in trans fat, such as fried foods. ? Food that is high in saturated fat, such as fatty meat. ? Sweets, desserts, sugary drinks, and other foods with added sugar. ? Full-fat dairy products.  Do not salt foods before eating.  Try to eat at least 2 vegetarian meals each week.  Eat more home-cooked food and less restaurant, buffet, and fast food.  When eating at a restaurant, ask that your food be prepared with less salt or no salt, if possible. What foods are recommended? The items listed may not be a complete list. Talk with your dietitian about   what dietary choices are best for you. Grains Whole-grain or whole-wheat bread. Whole-grain or whole-wheat pasta. Brown rice. Oatmeal. Quinoa. Bulgur. Whole-grain and low-sodium cereals. Pita bread. Low-fat, low-sodium crackers. Whole-wheat flour tortillas. Vegetables Fresh or frozen vegetables (raw, steamed, roasted, or grilled). Low-sodium or reduced-sodium tomato and vegetable juice. Low-sodium or reduced-sodium tomato sauce and tomato paste. Low-sodium or reduced-sodium canned vegetables. Fruits All fresh, dried, or frozen fruit. Canned fruit in natural juice (without  added sugar). Meat and other protein foods Skinless chicken or turkey. Ground chicken or turkey. Pork with fat trimmed off. Fish and seafood. Egg whites. Dried beans, peas, or lentils. Unsalted nuts, nut butters, and seeds. Unsalted canned beans. Lean cuts of beef with fat trimmed off. Low-sodium, lean deli meat. Dairy Low-fat (1%) or fat-free (skim) milk. Fat-free, low-fat, or reduced-fat cheeses. Nonfat, low-sodium ricotta or cottage cheese. Low-fat or nonfat yogurt. Low-fat, low-sodium cheese. Fats and oils Soft margarine without trans fats. Vegetable oil. Low-fat, reduced-fat, or light mayonnaise and salad dressings (reduced-sodium). Canola, safflower, olive, soybean, and sunflower oils. Avocado. Seasoning and other foods Herbs. Spices. Seasoning mixes without salt. Unsalted popcorn and pretzels. Fat-free sweets. What foods are not recommended? The items listed may not be a complete list. Talk with your dietitian about what dietary choices are best for you. Grains Baked goods made with fat, such as croissants, muffins, or some breads. Dry pasta or rice meal packs. Vegetables Creamed or fried vegetables. Vegetables in a cheese sauce. Regular canned vegetables (not low-sodium or reduced-sodium). Regular canned tomato sauce and paste (not low-sodium or reduced-sodium). Regular tomato and vegetable juice (not low-sodium or reduced-sodium). Pickles. Olives. Fruits Canned fruit in a light or heavy syrup. Fried fruit. Fruit in cream or butter sauce. Meat and other protein foods Fatty cuts of meat. Ribs. Fried meat. Bacon. Sausage. Bologna and other processed lunch meats. Salami. Fatback. Hotdogs. Bratwurst. Salted nuts and seeds. Canned beans with added salt. Canned or smoked fish. Whole eggs or egg yolks. Chicken or turkey with skin. Dairy Whole or 2% milk, cream, and half-and-half. Whole or full-fat cream cheese. Whole-fat or sweetened yogurt. Full-fat cheese. Nondairy creamers. Whipped toppings.  Processed cheese and cheese spreads. Fats and oils Butter. Stick margarine. Lard. Shortening. Ghee. Bacon fat. Tropical oils, such as coconut, palm kernel, or palm oil. Seasoning and other foods Salted popcorn and pretzels. Onion salt, garlic salt, seasoned salt, table salt, and sea salt. Worcestershire sauce. Tartar sauce. Barbecue sauce. Teriyaki sauce. Soy sauce, including reduced-sodium. Steak sauce. Canned and packaged gravies. Fish sauce. Oyster sauce. Cocktail sauce. Horseradish that you find on the shelf. Ketchup. Mustard. Meat flavorings and tenderizers. Bouillon cubes. Hot sauce and Tabasco sauce. Premade or packaged marinades. Premade or packaged taco seasonings. Relishes. Regular salad dressings. Where to find more information:  National Heart, Lung, and Blood Institute: www.nhlbi.nih.gov  American Heart Association: www.heart.org Summary  The DASH eating plan is a healthy eating plan that has been shown to reduce high blood pressure (hypertension). It may also reduce your risk for type 2 diabetes, heart disease, and stroke.  With the DASH eating plan, you should limit salt (sodium) intake to 2,300 mg a day. If you have hypertension, you may need to reduce your sodium intake to 1,500 mg a day.  When on the DASH eating plan, aim to eat more fresh fruits and vegetables, whole grains, lean proteins, low-fat dairy, and heart-healthy fats.  Work with your health care provider or diet and nutrition specialist (dietitian) to adjust your eating plan to your   individual calorie needs. This information is not intended to replace advice given to you by your health care provider. Make sure you discuss any questions you have with your health care provider. Document Revised: 04/10/2017 Document Reviewed: 04/21/2016 Elsevier Patient Education  2020 Elsevier Inc.  

## 2019-09-13 ENCOUNTER — Encounter: Payer: Self-pay | Admitting: Family Medicine

## 2019-09-13 ENCOUNTER — Other Ambulatory Visit: Payer: Self-pay

## 2019-09-13 ENCOUNTER — Ambulatory Visit: Payer: BC Managed Care – PPO | Admitting: Family Medicine

## 2019-09-13 VITALS — BP 134/90 | Temp 97.9°F | Ht 64.0 in | Wt 184.0 lb

## 2019-09-13 DIAGNOSIS — R1013 Epigastric pain: Secondary | ICD-10-CM | POA: Diagnosis not present

## 2019-09-13 DIAGNOSIS — I10 Essential (primary) hypertension: Secondary | ICD-10-CM

## 2019-09-13 NOTE — Progress Notes (Signed)
   Subjective:    Patient ID: April Finley, female    DOB: 21-May-1975, 44 y.o.   MRN: [id number]  Hypertension This is a chronic problem. Compliance problems include diet and exercise (takes meds every day, walking some but not as much as she should).    Pt states no concerns today.  Patient tries to watch her diet tries to avoid NSAIDs tries to eat healthy and drink plenty liquids denies excessive stress  Review of Systems     Objective:   Physical Exam Lungs clear respiratory rate normal heart regular no murmurs abdomen soft no guarding rebound or tenderness   Blood pressure cuff she has not reads approximately 5 points higher systolic and diastolic    Assessment & Plan:  HTN fair control Healthy diet regular activity keep weight check Continue medications Avoid NSAIDs Follow-up in 3 months If blood pressure not where we want to be then will need to consider different medication  Dyspepsia after taking medication on empty stomach this morning should gradually get better with Mylanta over the next few days if not better by Friday call I doubt problem such as pancreatitis

## 2019-10-07 ENCOUNTER — Encounter: Payer: Self-pay | Admitting: Family Medicine

## 2019-10-07 ENCOUNTER — Other Ambulatory Visit: Payer: Self-pay | Admitting: *Deleted

## 2019-10-07 ENCOUNTER — Telehealth: Payer: Self-pay | Admitting: Obstetrics & Gynecology

## 2019-10-07 NOTE — Telephone Encounter (Signed)
Given her symptoms Heavy bleeding as well as feeling weak it would probably be wise for her to go and go to urgent care to make sure she is not getting significantly anemic They would also initiate medication to help slow down the bleeding until she can get in with gynecology

## 2019-10-07 NOTE — Telephone Encounter (Signed)
Called and discussed with pt. Pt states she will go to urgent care

## 2019-10-07 NOTE — Telephone Encounter (Signed)
Called and discussed with pt and called [facility] and spoke with ashley to get pt an appt urgently. Morrie Sheldon states she is going to send provider a note since they are booked up.

## 2019-10-07 NOTE — Telephone Encounter (Signed)
Nurse from [facility] requested that we work patient in urgently due to having severe bleeding and iron low wants to discuss birth control. Nurse states there is a message from dr scott in the chart side that should be reviewed with detailed information.

## 2019-10-07 NOTE — Telephone Encounter (Signed)
Nurses Given her age I would not feel comfortable initiating birth control pill without having further evaluation of the bleeding by [facility]. I recommend appointment soon as possible with this If patient having severe lower abdominal pain and bleeding she ought to go to the ER If hemorrhaging off go to ER  Please send a phone message with this MyChart message referenced to [facility].

## 2019-10-07 NOTE — Telephone Encounter (Signed)
Called pt back to see if she heard from gyn and she states they did call and make her an appt for June 7th and told her to go to urgent care if she has having heavy bleeding. I gave pt number to urgent care and told her they were open on Saturday and Sunday if she needed

## 2019-10-12 ENCOUNTER — Encounter: Payer: Self-pay | Admitting: Obstetrics and Gynecology

## 2019-10-12 ENCOUNTER — Ambulatory Visit (INDEPENDENT_AMBULATORY_CARE_PROVIDER_SITE_OTHER): Payer: BC Managed Care – PPO | Admitting: Obstetrics and Gynecology

## 2019-10-12 ENCOUNTER — Other Ambulatory Visit: Payer: Self-pay | Admitting: Obstetrics and Gynecology

## 2019-10-12 VITALS — BP 133/92 | HR 93 | Ht 64.0 in | Wt 182.0 lb

## 2019-10-12 DIAGNOSIS — D509 Iron deficiency anemia, unspecified: Secondary | ICD-10-CM | POA: Diagnosis not present

## 2019-10-12 DIAGNOSIS — N920 Excessive and frequent menstruation with regular cycle: Secondary | ICD-10-CM

## 2019-10-12 DIAGNOSIS — Z8742 Personal history of other diseases of the female genital tract: Secondary | ICD-10-CM

## 2019-10-12 DIAGNOSIS — Z3202 Encounter for pregnancy test, result negative: Secondary | ICD-10-CM

## 2019-10-12 LAB — POCT URINE PREGNANCY: Preg Test, Ur: NEGATIVE

## 2019-10-12 NOTE — Progress Notes (Signed)
Patient ID: Jaye Beagle, female   DOB: 1975-11-25, 44 y.o.   MRN: [id number]    [facility] Visit  @DATE @            Patient name: April Finley MRN [id number]  Date of birth: October 13, 1975  CC & HPI:  April Finley is a 44 y.o. female presenting today for heavy periods, low iron, and to discuss her options for birth control. Her periods typically last 5-10 days but it varies from month to month. When her periods last 5 days, she uses around 4-5 pads daily. When they last 7 days, she changes her pad 6-7 times daily. By the fourth or fifth day, the bleeding becomes lighter. She does not sleep with a towel under her, but she does bleed through her clothes during the day. She is currently bleeding and this period began about one week ago. The patient has not had an internal exam recently and her last pap was in 2013.  The patient has been getting iron infusions for the past few months. Her most recent Iron was She is getting blood work done at the beginning of July. The patient is sexually active and has her tubes tied. The patient denies fever, chills or any other symptoms or complaints at this time.   ROS:  ROS  + heavy periods - fever - chills All systems are negative except as noted in the HPI and PMH.   Pertinent History Reviewed:   Reviewed: Medical         Past Medical History:  Diagnosis Date   Hypertension    Migraine                               Surgical Hx:    Past Surgical History:  Procedure Laterality Date   TUBAL LIGATION     Medications: Reviewed & Updated - see associated section                       Current Outpatient Medications:    ergocalciferol (VITAMIN D2) 1.25 MG (50000 UT) capsule, Take 1 capsule (50,000 Units total) by mouth once a week., Disp: 16 capsule, Rfl: 3   hydrALAZINE (APRESOLINE) 25 MG tablet, TAKE 1 TABLET BY MOUTH THREE TIMES A DAY, Disp: 270 tablet, Rfl: 0   indomethacin (INDOCIN) 25 MG capsule, Take 1 capsule (25 mg total) by  mouth 3 (three) times daily as needed (menstral cramps)., Disp: 30 capsule, Rfl: 2   naproxen (NAPROSYN) 375 MG tablet, Take 375 mg by mouth 2 (two) times daily with a meal. Pt unsure of dosage, it is prescribed., Disp: , Rfl:    triamterene-hydrochlorothiazide (DYAZIDE) 37.5-25 MG capsule, Take 1 each (1 capsule total) by mouth daily., Disp: 30 capsule, Rfl: 5 No current facility-administered medications for this visit.  Facility-Administered Medications Ordered in Other Visits:    0.9 %  sodium chloride infusion, , Intravenous, Continuous, Derek Jack, MD, Last Rate: 100 mL/hr at 05/14/18 1600, Rate Verify at 05/14/18 1600  Social History: Reviewed -  reports that she has never smoked. She has never used smokeless tobacco.  Objective Findings:  Vitals: Blood pressure (!) 133/92, pulse 93, height 5\' 4"  (1.626 m), weight 182 lb (82.6 kg), last menstrual period 10/05/2019.  PHYSICAL EXAMINATION General appearance - alert, well appearing, and in no distress, oriented to person, place, and time and overweight Mental status - alert, oriented  to person, place, and time, normal mood, behavior, speech, dress, motor activity, and thought processes, affect appropriate to mood  PELVIC DEFERRED CBC Latest Ref Rng & Units 07/06/2019 12/07/2018 10/05/2018  WBC 3.4 - 10.8 x10E3/uL 4.3 5.5 4.7  Hemoglobin 11.1 - 15.9 g/dL 62.5 63.8 15.3(H)  Hematocrit 34.0 - 46.6 % 40.7 45.3 48.3(H)  Platelets 150 - 450 x10E3/uL 356 269 301    Discussed with pt risks and benefits of IUDs, progesterone only pills, and endometrial ablation. At end of discussion, pt had opportunity to ask questions and has no further questions at this time.   Greater than 50% was spent in counseling and coordination of care with the patient. Total time greater than: 25 minutes  Assessment & Plan:   A:  1. Chronic iron deficiency 2. HMB (heavy menstrual bleeding) 3. Needs Pap last Pap 2013  P:  1. Scheduled transvaginal  U/S for tomorrow 2. Scheduled pap and internal exam for 0830 on Friday 3. Supplied information on IUDs, endometrial ablation and progesterone only pills  By signing my name below, I, Pietro Cassis, attest that this documentation has been prepared under the direction and in the presence of Tilda Burrow, MD. Electronically Signed: Pietro Cassis, Medical Scribe. 10/12/19. 9:02 AM.  I personally performed the services described in this documentation, which was SCRIBED in my presence. The recorded information has been reviewed and considered accurate. It has been edited as necessary during review. Tilda Burrow, MD

## 2019-10-13 ENCOUNTER — Other Ambulatory Visit: Payer: BC Managed Care – PPO

## 2019-10-14 ENCOUNTER — Other Ambulatory Visit: Payer: BC Managed Care – PPO | Admitting: Obstetrics and Gynecology

## 2019-10-17 ENCOUNTER — Ambulatory Visit: Payer: BC Managed Care – PPO | Admitting: Obstetrics & Gynecology

## 2019-10-18 ENCOUNTER — Telehealth: Payer: BC Managed Care – PPO | Admitting: Family Medicine

## 2019-10-27 ENCOUNTER — Other Ambulatory Visit: Payer: BC Managed Care – PPO | Admitting: Obstetrics and Gynecology

## 2019-10-27 ENCOUNTER — Other Ambulatory Visit: Payer: BC Managed Care – PPO

## 2019-11-09 ENCOUNTER — Ambulatory Visit
Admission: EM | Admit: 2019-11-09 | Discharge: 2019-11-09 | Disposition: A | Discharge status: Home / Self Care | Payer: BC Managed Care – PPO | Attending: Emergency Medicine | Admitting: Emergency Medicine

## 2019-11-09 ENCOUNTER — Encounter: Payer: Self-pay | Admitting: Emergency Medicine

## 2019-11-09 DIAGNOSIS — I10 Essential (primary) hypertension: Secondary | ICD-10-CM | POA: Diagnosis not present

## 2019-11-09 DIAGNOSIS — R030 Elevated blood-pressure reading, without diagnosis of hypertension: Secondary | ICD-10-CM

## 2019-11-09 DIAGNOSIS — L299 Pruritus, unspecified: Secondary | ICD-10-CM

## 2019-11-09 DIAGNOSIS — R21 Rash and other nonspecific skin eruption: Secondary | ICD-10-CM

## 2019-11-09 MED ORDER — HYDROXYZINE HCL 25 MG PO TABS: 25.0000 mg | ORAL_TABLET | Freq: Three times a day (TID) | ORAL | 0 refills | Status: DC | PRN

## 2019-11-09 MED ORDER — PREDNISONE 20 MG PO TABS: 20.0000 mg | ORAL_TABLET | Freq: Two times a day (BID) | ORAL | 0 refills | Status: AC

## 2019-11-09 NOTE — Discharge Instructions (Signed)
May try taking a half dose of HCTZ at night as well, but please consult with PCP Please continue to monitor blood pressure at home and keep a log Eat a well balanced diet of fruits, vegetables and lean meats.  Avoid foods high in fat and salt Drink water.  At least half your body weight in ounces Exercise for at least 30 minutes daily Follow up with PCP Return or go to the ED if you have any new or worsening symptoms such as vision changes, fatigue, dizziness, chest pain, shortness of breath, nausea, swelling in your hands or feet, urinary symptoms, etc...  Steroid prescribed for rash.  Take as directed and to completion Hydroxyzine for itching. This medication may make you drowsy, DO NOT TAKE prior to driving or operating heavy machinery Follow up as needed

## 2019-11-09 NOTE — ED Triage Notes (Signed)
Blood pressure was 170/102 at work today.  Rash to arms, neck and under breast.  Rash started under breast x 2 weeks ago and to the other areas of her body yesterday.

## 2019-11-09 NOTE — ED Provider Notes (Signed)
[facility]   [id number] 11/09/19 Arrival Time: 1323  CC:HBP; rash  SUBJECTIVE:  April Finley is a 44 y.o. female who presents for elevated blood pressure x 1 day.  170/102 at work, 161/107 in office.  Hx of HTN x 20 years.  Currently takes two blood pressure medications including triamterene-HCTZ and hydralazine.  Unable to be seen by PCP today.  Denies HA, vision changes, dizziness, lightheadedness, chest pain, shortness of breath, numbness or tingling in extremities, abdominal pain, changes in bowel or bladder habits.     Patient also mentions itchy rash to bilateral arms and underneath bra line x 1-2 weeks.  Denies precipitating event, trauma, changes in detergent, soaps, body lotion, or medications.  Denies close contacts with similar rash.  Denies similar rash in the past.  Worse with scratching.  Denies oral sores or genital sores, drainage, bleeding, pain.     ROS: As per HPI.  All other pertinent ROS negative.     Past Medical History:  Diagnosis Date  . Hypertension   . Migraine    Past Surgical History:  Procedure Laterality Date  . TUBAL LIGATION     Allergies  Allergen Reactions  . Penicillins Swelling and Rash    Has patient had a PCN reaction causing immediate rash, facial/tongue/throat swelling, SOB or lightheadedness with hypotension: no Has patient had a PCN reaction causing severe rash involving mucus membranes or skin necrosis: No Has patient had a PCN reaction that required hospitalization No Has patient had a PCN reaction occurring within the last 10 years: No If all of the above answers are "NO", then may proceed with Cephalosporin use.   Marland Kitchen Lisinopril     Had angioedema (was also on amlodipine at the time)   Current Facility-Administered Medications on File Prior to Encounter  Medication Dose Route Frequency Provider Last Rate Last Admin  . 0.9 %  sodium chloride infusion   Intravenous Continuous Doreatha Massed, MD 100 mL/hr at 05/14/18  1600 Rate Verify at 05/14/18 1600   Current Outpatient Medications on File Prior to Encounter  Medication Sig Dispense Refill  . ergocalciferol (VITAMIN D2) 1.25 MG (50000 UT) capsule Take 1 capsule (50,000 Units total) by mouth once a week. 16 capsule 3  . hydrALAZINE (APRESOLINE) 25 MG tablet TAKE 1 TABLET BY MOUTH THREE TIMES A DAY 270 tablet 0  . indomethacin (INDOCIN) 25 MG capsule Take 1 capsule (25 mg total) by mouth 3 (three) times daily as needed (menstral cramps). 30 capsule 2  . naproxen (NAPROSYN) 375 MG tablet Take 375 mg by mouth 2 (two) times daily with a meal. Pt unsure of dosage, it is prescribed.    . triamterene-hydrochlorothiazide (DYAZIDE) 37.5-25 MG capsule Take 1 each (1 capsule total) by mouth daily. 30 capsule 5   Social History   Socioeconomic History  . Marital status: Divorced    Spouse name: Not on file  . Number of children: Not on file  . Years of education: Not on file  . Highest education level: Not on file  Occupational History  . Not on file  Tobacco Use  . Smoking status: Never Smoker  . Smokeless tobacco: Never Used  Vaping Use  . Vaping Use: Never used  Substance and Sexual Activity  . Alcohol use: No  . Drug use: No  . Sexual activity: Yes    Birth control/protection: None  Other Topics Concern  . Not on file  Social History Narrative  . Not on file  Social Determinants of Health   Financial Resource Strain:   . Difficulty of Paying Living Expenses:   Food Insecurity:   . Worried About Programme researcher, broadcasting/film/video in the Last Year:   . Barista in the Last Year:   Transportation Needs:   . Freight forwarder (Medical):   Marland Kitchen Lack of Transportation (Non-Medical):   Physical Activity:   . Days of Exercise per Week:   . Minutes of Exercise per Session:   Stress:   . Feeling of Stress :   Social Connections:   . Frequency of Communication with Friends and Family:   . Frequency of Social Gatherings with Friends and Family:   .  Attends Religious Services:   . Active Member of Clubs or Organizations:   . Attends Banker Meetings:   Marland Kitchen Marital Status:   Intimate Partner Violence:   . Fear of Current or Ex-Partner:   . Emotionally Abused:   Marland Kitchen Physically Abused:   . Sexually Abused:    Family History  Problem Relation Age of Onset  . Hypertension Mother   . Kidney disease Mother        lupus,HTN  . Hypertension Brother   . Heart disease Maternal Grandmother   . Hyperlipidemia Maternal Grandmother   . Hypertension Maternal Grandmother   . Cancer Maternal Grandfather        colon    OBJECTIVE:  Vitals:   11/09/19 1341 11/09/19 1342  BP: (!) 161/107   Pulse: 84   Resp: 17   Temp: 98.7 F (37.1 C)   TempSrc: Oral   SpO2: 97%   Weight:  176 lb (79.8 kg)  Height:  5\' 4"  (1.626 m)    General appearance: alert; no distress Eyes: PERRLA; EOMI HENT: normocephalic; atraumatic; oropharynx clear Neck: supple with FROM Lungs: clear to auscultation bilaterally Heart: regular rate and rhythm.   Skin: warm and dry; erythematous macules/ papules to bilateral antecubital regions and underneath bilateral breast Neurologic: normal gait Psychological: alert and cooperative; normal mood and affect   ASSESSMENT & PLAN:  1. Essential hypertension   2. Elevated blood pressure reading   3. Rash and nonspecific skin eruption   4. Itching     Meds ordered this encounter  Medications  . predniSONE (DELTASONE) 20 MG tablet    Sig: Take 1 tablet (20 mg total) by mouth 2 (two) times daily with a meal for 5 days.    Dispense:  10 tablet    Refill:  0    Order Specific Question:   Supervising Provider    Answer:   Eustace Moore  . hydrOXYzine (ATARAX/VISTARIL) 25 MG tablet    Sig: Take 1 tablet (25 mg total) by mouth every 8 (eight) hours as needed for anxiety or itching.    Dispense:  20 tablet    Refill:  0    Order Specific Question:   Supervising Provider    Answer:   [[id number]] Eustace Moore   May try taking a half dose of HCTZ at night as well, but please consult with PCP Please continue to monitor blood pressure at home and keep a log Eat a well balanced diet of fruits, vegetables and lean meats.  Avoid foods high in fat and salt Drink water.  At least half your body weight in ounces Exercise for at least 30 minutes daily Follow up with PCP Return or go to the ED if you have any new or  worsening symptoms such as vision changes, fatigue, dizziness, chest pain, shortness of breath, nausea, swelling in your hands or feet, urinary symptoms, etc...  Steroid prescribed for rash.  Take as directed and to completion Hydroxyzine for itching. This medication may make you drowsy, DO NOT TAKE prior to driving or operating heavy machinery Follow up as needed  Reviewed expectations re: course of current medical issues. Questions answered. Outlined signs and symptoms indicating need for more acute intervention. Patient verbalized understanding. After Visit Summary given.   Rennis Harding, PA-C 11/09/19 1406

## 2019-11-15 ENCOUNTER — Inpatient Hospital Stay (HOSPITAL_COMMUNITY): Payer: BC Managed Care – PPO | Attending: Hematology

## 2019-11-18 ENCOUNTER — Ambulatory Visit (HOSPITAL_COMMUNITY): Payer: BC Managed Care – PPO | Admitting: Nurse Practitioner

## 2019-11-22 ENCOUNTER — Other Ambulatory Visit: Payer: Self-pay | Admitting: Women's Health

## 2019-11-22 MED ORDER — MEGESTROL ACETATE 40 MG PO TABS: ORAL_TABLET | ORAL | 1 refills | Status: DC

## 2019-11-24 ENCOUNTER — Other Ambulatory Visit: Payer: BC Managed Care – PPO

## 2019-11-24 ENCOUNTER — Other Ambulatory Visit: Payer: BC Managed Care – PPO | Admitting: Advanced Practice Midwife

## 2019-12-14 ENCOUNTER — Ambulatory Visit: Payer: BC Managed Care – PPO | Admitting: Family Medicine

## 2019-12-14 ENCOUNTER — Encounter: Payer: Self-pay | Admitting: Family Medicine

## 2019-12-14 ENCOUNTER — Other Ambulatory Visit: Payer: Self-pay

## 2019-12-14 VITALS — BP 162/84 | Temp 97.7°F | Wt 187.2 lb

## 2019-12-14 DIAGNOSIS — L299 Pruritus, unspecified: Secondary | ICD-10-CM

## 2019-12-14 DIAGNOSIS — I10 Essential (primary) hypertension: Secondary | ICD-10-CM

## 2019-12-14 MED ORDER — HYDRALAZINE HCL 50 MG PO TABS: ORAL_TABLET | ORAL | 5 refills | Status: DC

## 2019-12-14 MED ORDER — PREDNISONE 20 MG PO TABS: ORAL_TABLET | ORAL | 0 refills | Status: DC

## 2019-12-14 NOTE — Progress Notes (Signed)
   Subjective:    Patient ID: April Finley, female    DOB: January 31, 1976, 44 y.o.   MRN: [id number]  Hypertension This is a chronic problem. Pertinent negatives include no chest pain or shortness of breath. There are no compliance problems.   Patient does a good job taking her medication.  Denies any type of chest tightness pressure pain shortness of breath. Patient does have an interesting issue with various spells of red rash on the arms and some on the back that has been intermittent over the past 2 weeks went to urgent care was treated with prednisone for a short span of time now having return of rash.  Denies any new medications new soaps detergents denies any new foods   Review of Systems  Constitutional: Negative for activity change, appetite change and fatigue.  HENT: Negative for congestion and rhinorrhea.   Respiratory: Negative for cough and shortness of breath.   Cardiovascular: Negative for chest pain and leg swelling.  Gastrointestinal: Negative for abdominal pain and diarrhea.  Endocrine: Negative for polydipsia and polyphagia.  Skin: Negative for color change.  Neurological: Negative for dizziness and weakness.  Psychiatric/Behavioral: Negative for behavioral problems and confusion.       Objective:   Physical Exam Vitals reviewed.  Constitutional:      General: She is not in acute distress. HENT:     Head: Normocephalic and atraumatic.  Eyes:     General:        Right eye: No discharge.        Left eye: No discharge.  Neck:     Trachea: No tracheal deviation.  Cardiovascular:     Rate and Rhythm: Normal rate and regular rhythm.     Heart sounds: Normal heart sounds. No murmur heard.   Pulmonary:     Effort: Pulmonary effort is normal. No respiratory distress.     Breath sounds: Normal breath sounds.  Lymphadenopathy:     Cervical: No cervical adenopathy.  Skin:    General: Skin is warm and dry.  Neurological:     Mental Status: She is alert.      Coordination: Coordination normal.  Psychiatric:        Behavior: Behavior normal.    There is an area of redness on the left arm.  I find no evidence of hives no rash noted       Assessment & Plan:  Possible allergic reaction-longer prednisone taper if symptoms persist family patient needs to let us know  Blood pressure subpar control increase hydralazine next dose 50 mg 3 times daily continue Dyazide.  Patient has a nurse at work that can check her blood pressure she will check it periodically in the next few weeks she will send [place] some readings she will follow-up in 3 months  Certainly if progressive symptoms or worse follow-up sooner

## 2020-01-16 ENCOUNTER — Encounter: Payer: Self-pay | Admitting: Family Medicine

## 2020-01-19 ENCOUNTER — Other Ambulatory Visit: Payer: Self-pay | Admitting: Women's Health

## 2020-01-20 ENCOUNTER — Telehealth: Payer: Self-pay | Admitting: Women's Health

## 2020-01-20 NOTE — Telephone Encounter (Signed)
Can you refill Megace? Thanks!! JSY

## 2020-01-20 NOTE — Telephone Encounter (Signed)
Patient would like refill of megace sent to [facility] [address] in [place].

## 2020-01-23 ENCOUNTER — Other Ambulatory Visit: Payer: Self-pay | Admitting: Obstetrics & Gynecology

## 2020-01-23 MED ORDER — MEGESTROL ACETATE 40 MG PO TABS: ORAL_TABLET | ORAL | 4 refills | Status: DC

## 2020-01-23 NOTE — Telephone Encounter (Signed)
refilled 

## 2020-01-23 NOTE — Telephone Encounter (Signed)
Pt aware Megace was refilled. JSY

## 2020-03-15 ENCOUNTER — Ambulatory Visit: Payer: BC Managed Care – PPO | Admitting: Family Medicine

## 2020-04-30 ENCOUNTER — Other Ambulatory Visit: Payer: Self-pay | Admitting: Obstetrics & Gynecology

## 2020-06-29 ENCOUNTER — Other Ambulatory Visit: Payer: Self-pay

## 2020-06-29 ENCOUNTER — Ambulatory Visit: Payer: BC Managed Care – PPO | Admitting: Family Medicine

## 2020-06-29 ENCOUNTER — Encounter: Payer: Self-pay | Admitting: Family Medicine

## 2020-06-29 VITALS — BP 197/152 | HR 104 | Temp 97.8°F | Wt 195.2 lb

## 2020-06-29 DIAGNOSIS — I10 Essential (primary) hypertension: Secondary | ICD-10-CM | POA: Diagnosis not present

## 2020-06-29 MED ORDER — HYDRALAZINE HCL 50 MG PO TABS: ORAL_TABLET | ORAL | 5 refills | Status: DC

## 2020-06-29 MED ORDER — POTASSIUM CHLORIDE CRYS ER 20 MEQ PO TBCR: EXTENDED_RELEASE_TABLET | ORAL | 5 refills | Status: DC

## 2020-06-29 MED ORDER — CHLORTHALIDONE 50 MG PO TABS: ORAL_TABLET | ORAL | 5 refills | Status: DC

## 2020-06-29 MED ORDER — AMLODIPINE BESYLATE 5 MG PO TABS: ORAL_TABLET | ORAL | 5 refills | Status: DC

## 2020-06-29 NOTE — Progress Notes (Signed)
   Subjective:    Patient ID: April Finley, female    DOB: 27-Oct-1975, 45 y.o.   MRN: [id number]  HPI Pt having ankle swelling for the past week. Swelling comes and goes. Swelling goes to mid calf; no pain. Pt is on Triamterene-HCTZ 37.5-25 mg once daily  Swelling around the ankles Blood pressure has been up Trying to do the best she can with her diet Under a fair amount of stress Has strong family history of hypertension  Review of Systems  Constitutional: Negative for activity change, fatigue and fever.  HENT: Negative for congestion and rhinorrhea.   Respiratory: Negative for cough, chest tightness and shortness of breath.   Cardiovascular: Negative for chest pain and leg swelling.  Gastrointestinal: Negative for abdominal pain and nausea.  Skin: Negative for color change.  Neurological: Negative for dizziness and headaches.  Psychiatric/Behavioral: Negative for agitation and behavioral problems.       Objective:   Physical Exam Vitals reviewed.  Constitutional:      Appearance: She is well-developed and well-nourished.  HENT:     Head: Normocephalic.  Cardiovascular:     Rate and Rhythm: Normal rate and regular rhythm.     Heart sounds: Normal heart sounds. No murmur heard.   Pulmonary:     Effort: Pulmonary effort is normal.     Breath sounds: Normal breath sounds.  Skin:    General: Skin is warm and dry.  Neurological:     Mental Status: She is alert.  Psychiatric:        Mood and Affect: Mood and affect normal.     Pedal edema is noted no crackles of the lungs Patient denies chest pain shortness of breath headaches     Assessment & Plan:  HTN subpar control Add amlodipine 5 mg 1 daily Symptoms blood pressure readings within 2 weeks Follow-up in 4 to 6 weeks Check lab work Hydralazine 3 times daily Stop triamterene Start chlorthalidone 50 mg 1 each morning Potassium 20 mEq 1 daily Await lab work

## 2020-06-29 NOTE — Patient Instructions (Signed)

## 2020-07-04 ENCOUNTER — Telehealth: Payer: Self-pay | Admitting: Family Medicine

## 2020-07-04 NOTE — Telephone Encounter (Signed)
Patient was seen 2/18 and prescribe new BP medication. She states making her dizzy and fatigue,wanting to know if that was normal and how long will it take for her new medication get in her system this is her third day of taking it. Please advise

## 2020-07-04 NOTE — Telephone Encounter (Signed)
Left message to return call 

## 2020-07-04 NOTE — Telephone Encounter (Signed)
It is possible the combination is causing some problems.  I would recommend trying half of hydralazine 3 times daily continue chlorthalidone as is continue amlodipine as is Nurses also recommend moving patient up to next week may have 3 PM appointment for 330 if 3 PM is not available

## 2020-07-04 NOTE — Telephone Encounter (Signed)
Pt checked bp yesterday and it was 142/99. States she does not feel bad but having dizziness when first taking meds. Wears off around 12 or 1. See message below.

## 2020-07-06 NOTE — Telephone Encounter (Signed)
Left message to return call and sent my chart message 

## 2020-07-09 ENCOUNTER — Other Ambulatory Visit: Payer: Self-pay | Admitting: *Deleted

## 2020-07-09 MED ORDER — HYDRALAZINE HCL 50 MG PO TABS: ORAL_TABLET | ORAL | 0 refills | Status: DC

## 2020-07-09 NOTE — Telephone Encounter (Signed)
Discussed with pt. Pt verbalzied understanding and transferred to the front to schedule an appt.

## 2020-07-12 ENCOUNTER — Other Ambulatory Visit: Payer: Self-pay | Admitting: Obstetrics & Gynecology

## 2020-07-17 ENCOUNTER — Other Ambulatory Visit: Payer: Self-pay

## 2020-07-17 ENCOUNTER — Ambulatory Visit: Payer: BC Managed Care – PPO | Admitting: Family Medicine

## 2020-07-17 VITALS — BP 138/86 | Temp 98.2°F | Ht 64.0 in | Wt 198.2 lb

## 2020-07-17 DIAGNOSIS — I10 Essential (primary) hypertension: Secondary | ICD-10-CM | POA: Diagnosis not present

## 2020-07-17 NOTE — Patient Instructions (Signed)
Stop hydralazine  Continue amlodipine and chlorthalidone Continue potassium  Send me readings next week as well as a update on how you are feeling

## 2020-07-17 NOTE — Progress Notes (Signed)
   Subjective:    Patient ID: April Finley, female    DOB: [birth year], 45 y.o.   MRN: [id number]  HPI Patient arrives for a follow up on blood pressure. Patient states he numbers have come down- 140s over 90s now but still feeling drained and having headaches.  Patient unfortunately had side effects with hydralazine we reduce the dose but she still has feelings of feeling sick nauseated feeling rundown Review of Systems   See above Objective:   Physical Exam  Lungs clear respiratory rate normal heart regular pulse normal blood pressure      Assessment & Plan:  HTN continue current measures Send [place] update next week Stop hydralazine Continue chlorthalidone Continue amlodipine May end up needing to have other intervention including possibility of clonidine Follow-up in 4 to 6 months

## 2020-07-25 ENCOUNTER — Ambulatory Visit: Payer: BC Managed Care – PPO | Admitting: Family Medicine

## 2020-09-01 ENCOUNTER — Other Ambulatory Visit: Payer: Self-pay | Admitting: Obstetrics & Gynecology

## 2020-10-18 ENCOUNTER — Telehealth: Payer: Self-pay | Admitting: Family Medicine

## 2020-10-18 NOTE — Telephone Encounter (Signed)
Pt states that the swelling does go down when she lays down. Pt verbalized understanding and transferred up front to schedule appt.

## 2020-10-18 NOTE — Telephone Encounter (Signed)
Pt calling in to report that the top of her feet are slightly swollen. Pt states that if she does not stay on the for a long period they do not swell as much. Yesterday she was on feet all day and feel were really swollen. Pt states no pain, no tenderness. Pt is taking Amlodipine 5 mg and Potassium 20 mEq daily as directed. Pt is wondering what she is needing to do. Please advise. Thank you (I will be out this afternoon; please route to pool) (Pt is aware Dr.Scott is out of the office until week)

## 2020-10-22 ENCOUNTER — Ambulatory Visit: Payer: BC Managed Care – PPO | Admitting: Family Medicine

## 2020-11-06 ENCOUNTER — Ambulatory Visit: Payer: BC Managed Care – PPO | Admitting: Family Medicine

## 2020-11-22 ENCOUNTER — Other Ambulatory Visit: Payer: Self-pay | Admitting: Obstetrics & Gynecology

## 2021-02-18 ENCOUNTER — Other Ambulatory Visit: Payer: Self-pay | Admitting: Obstetrics & Gynecology

## 2021-04-12 ENCOUNTER — Telehealth: Payer: Self-pay | Admitting: Family Medicine

## 2021-04-12 NOTE — Telephone Encounter (Signed)
Sent my chart message 04/12/2021

## 2021-04-30 NOTE — Telephone Encounter (Signed)
Sent second message to schedule appointment  04/30/21

## 2021-05-02 ENCOUNTER — Ambulatory Visit (INDEPENDENT_AMBULATORY_CARE_PROVIDER_SITE_OTHER): Payer: Self-pay

## 2021-05-02 ENCOUNTER — Encounter (HOSPITAL_COMMUNITY): Payer: Self-pay | Admitting: Internal Medicine

## 2021-05-02 ENCOUNTER — Ambulatory Visit
Admission: EM | Admit: 2021-05-02 | Discharge: 2021-05-02 | Disposition: A | Discharge status: Home / Self Care | Payer: BC Managed Care – PPO | Attending: Family Medicine | Admitting: Family Medicine

## 2021-05-02 ENCOUNTER — Encounter: Payer: Self-pay | Admitting: Emergency Medicine

## 2021-05-02 ENCOUNTER — Other Ambulatory Visit: Payer: Self-pay

## 2021-05-02 DIAGNOSIS — S62626A Displaced fracture of medial phalanx of right little finger, initial encounter for closed fracture: Secondary | ICD-10-CM | POA: Diagnosis not present
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
## 2021-05-07 ENCOUNTER — Encounter (HOSPITAL_COMMUNITY): Payer: Self-pay | Admitting: Internal Medicine

## 2021-05-08 ENCOUNTER — Other Ambulatory Visit: Payer: Self-pay

## 2021-05-08 ENCOUNTER — Ambulatory Visit: Payer: BC Managed Care – PPO

## 2021-05-08 ENCOUNTER — Ambulatory Visit: Payer: BC Managed Care – PPO | Admitting: Orthopedic Surgery

## 2021-05-08 ENCOUNTER — Encounter: Payer: Self-pay | Admitting: Orthopedic Surgery

## 2021-05-08 VITALS — BP 152/118 | HR 115 | Ht 64.0 in | Wt 206.0 lb

## 2021-05-08 DIAGNOSIS — S62626A Displaced fracture of medial phalanx of right little finger, initial encounter for closed fracture: Secondary | ICD-10-CM
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
## 2021-05-12 ENCOUNTER — Other Ambulatory Visit: Payer: Self-pay | Admitting: Family Medicine

## 2021-05-15 NOTE — Telephone Encounter (Signed)
This is the 3 message sent to schedule appointment no response

## 2021-05-19 ENCOUNTER — Other Ambulatory Visit: Payer: Self-pay | Admitting: Obstetrics & Gynecology

## 2021-05-20 ENCOUNTER — Ambulatory Visit: Payer: BC Managed Care – PPO | Admitting: Orthopedic Surgery

## 2021-05-21 ENCOUNTER — Ambulatory Visit (INDEPENDENT_AMBULATORY_CARE_PROVIDER_SITE_OTHER): Payer: BC Managed Care – PPO | Admitting: Orthopedic Surgery

## 2021-05-21 ENCOUNTER — Other Ambulatory Visit: Payer: Self-pay

## 2021-05-21 ENCOUNTER — Encounter: Payer: Self-pay | Admitting: Orthopedic Surgery

## 2021-05-21 ENCOUNTER — Ambulatory Visit: Payer: Self-pay

## 2021-05-21 VITALS — BP 131/80 | HR 107

## 2021-05-21 DIAGNOSIS — S62626A Displaced fracture of medial phalanx of right little finger, initial encounter for closed fracture: Secondary | ICD-10-CM | POA: Diagnosis not present
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
## 2021-05-24 NOTE — Telephone Encounter (Signed)
Left phone message to schedule appointment 05/24/21

## 2021-06-04 ENCOUNTER — Ambulatory Visit: Payer: BC Managed Care – PPO | Admitting: Orthopedic Surgery

## 2021-06-06 ENCOUNTER — Ambulatory Visit: Payer: BC Managed Care – PPO | Admitting: Orthopedic Surgery

## 2021-06-16 ENCOUNTER — Other Ambulatory Visit: Payer: Self-pay | Admitting: Family Medicine

## 2021-06-17 NOTE — Telephone Encounter (Signed)
Sent 3rd message to schedule appointment 06/17/21

## 2021-06-19 NOTE — Telephone Encounter (Signed)
No response

## 2021-07-02 ENCOUNTER — Other Ambulatory Visit: Payer: Self-pay | Admitting: Family Medicine

## 2021-07-24 ENCOUNTER — Other Ambulatory Visit: Payer: Self-pay | Admitting: Family Medicine

## 2021-08-08 ENCOUNTER — Other Ambulatory Visit: Payer: Self-pay | Admitting: Family Medicine

## 2021-11-07 ENCOUNTER — Other Ambulatory Visit: Payer: Self-pay | Admitting: Family Medicine

## 2021-11-07 ENCOUNTER — Other Ambulatory Visit: Payer: Self-pay | Admitting: Obstetrics & Gynecology

## 2021-11-15 NOTE — Telephone Encounter (Signed)
Made Med Check appt with Leonna on Tuesday at 07/11 2:30

## 2021-11-19 ENCOUNTER — Ambulatory Visit: Payer: BC Managed Care – PPO | Admitting: Nurse Practitioner

## 2021-11-26 ENCOUNTER — Ambulatory Visit: Payer: BC Managed Care – PPO | Admitting: Nurse Practitioner

## 2021-12-04 ENCOUNTER — Other Ambulatory Visit: Payer: Self-pay | Admitting: Family Medicine

## 2021-12-04 NOTE — Telephone Encounter (Signed)
Last seen 07/17/20 , has multiple cancelled appts.

## 2021-12-20 ENCOUNTER — Other Ambulatory Visit: Payer: Self-pay | Admitting: Family Medicine

## 2021-12-22 ENCOUNTER — Other Ambulatory Visit: Payer: Self-pay | Admitting: Family Medicine

## 2021-12-23 NOTE — Telephone Encounter (Signed)
Sent my chart message to schedule appointment 8/14

## 2021-12-30 ENCOUNTER — Other Ambulatory Visit: Payer: Self-pay | Admitting: Family Medicine

## 2021-12-31 NOTE — Telephone Encounter (Signed)
Sent my chart message again this is the third one 12/31/21

## 2022-01-14 IMAGING — DX DG HAND COMPLETE 3+V*R*
3 series · 3 of 3 positions shown · non-contrast
Comparison: None.

CLINICAL DATA: Right little finger injury, pain

EXAM:
RIGHT HAND - COMPLETE 3+ VIEW

[hand pa]
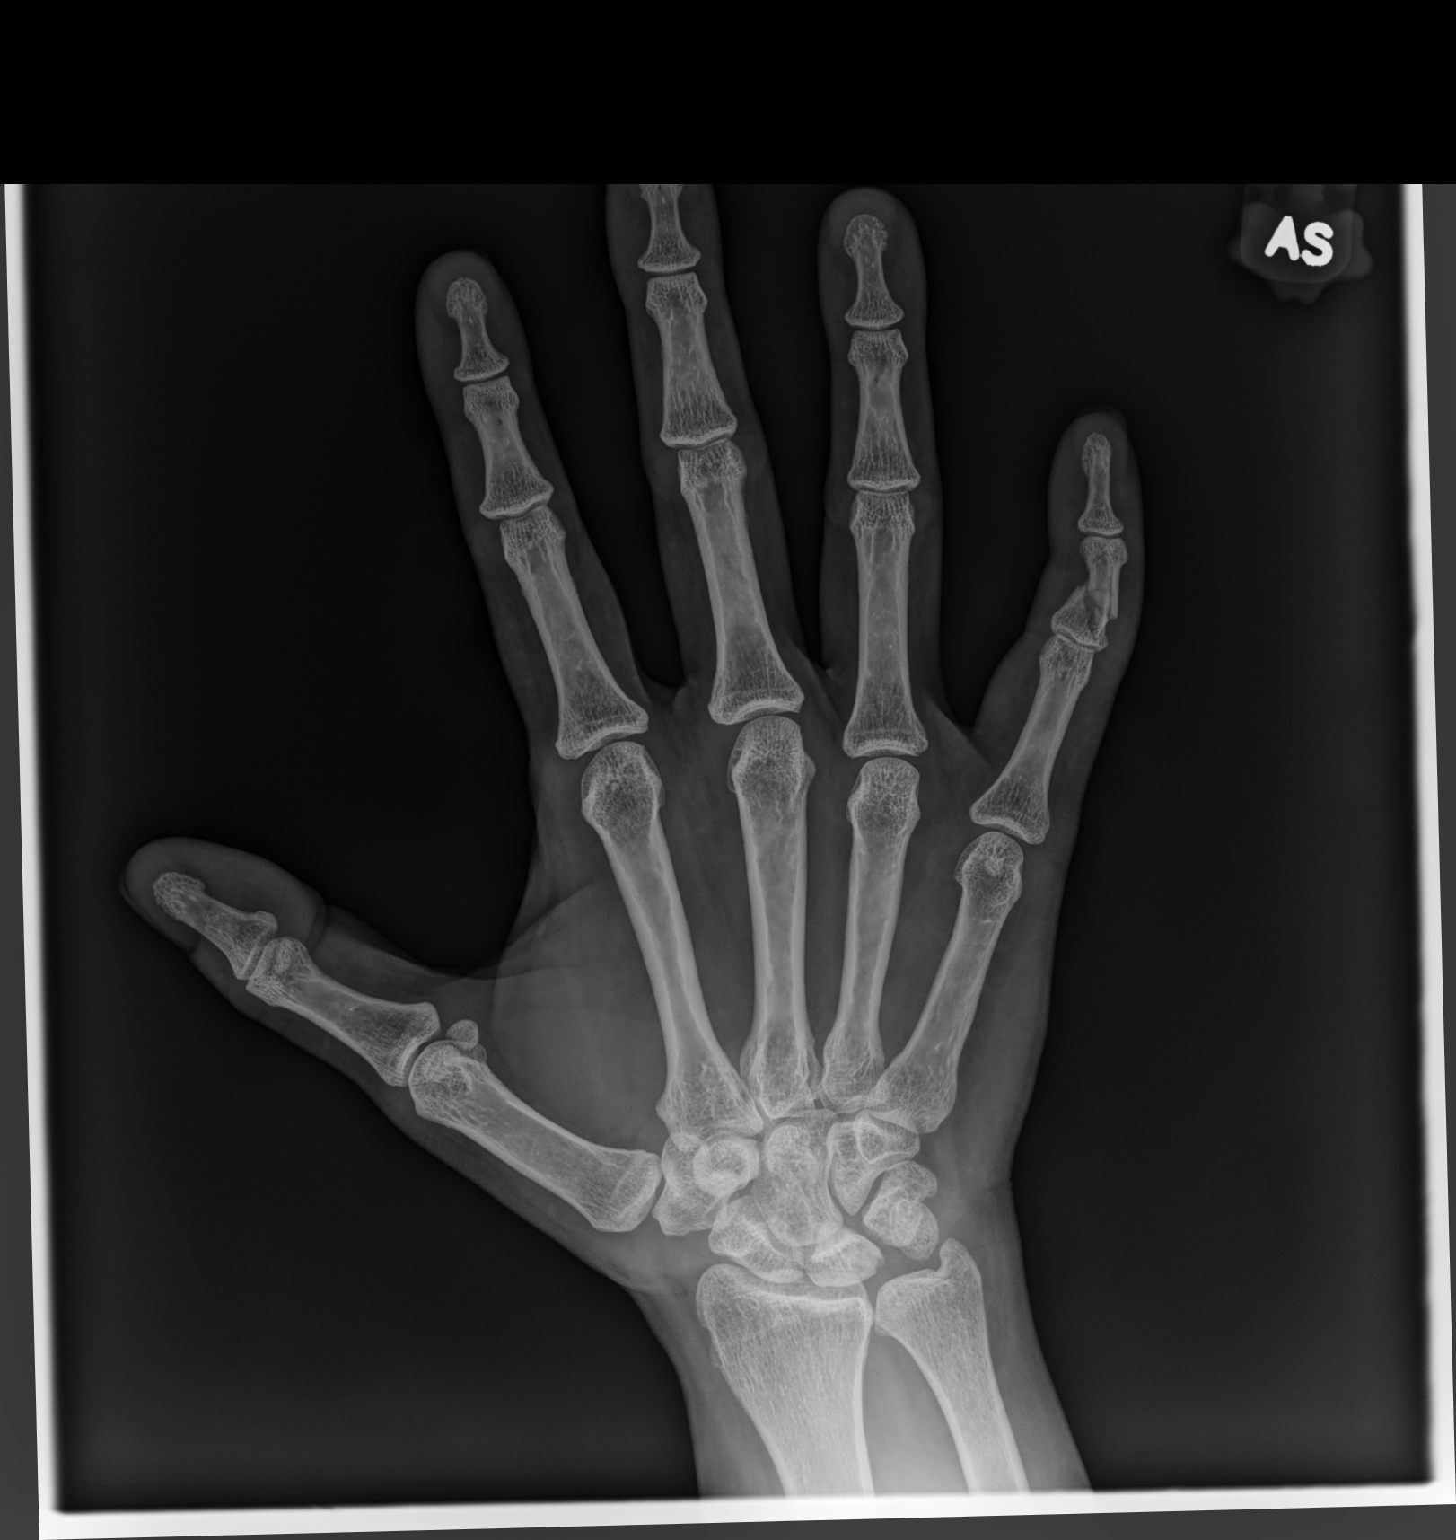

[hand mlo]
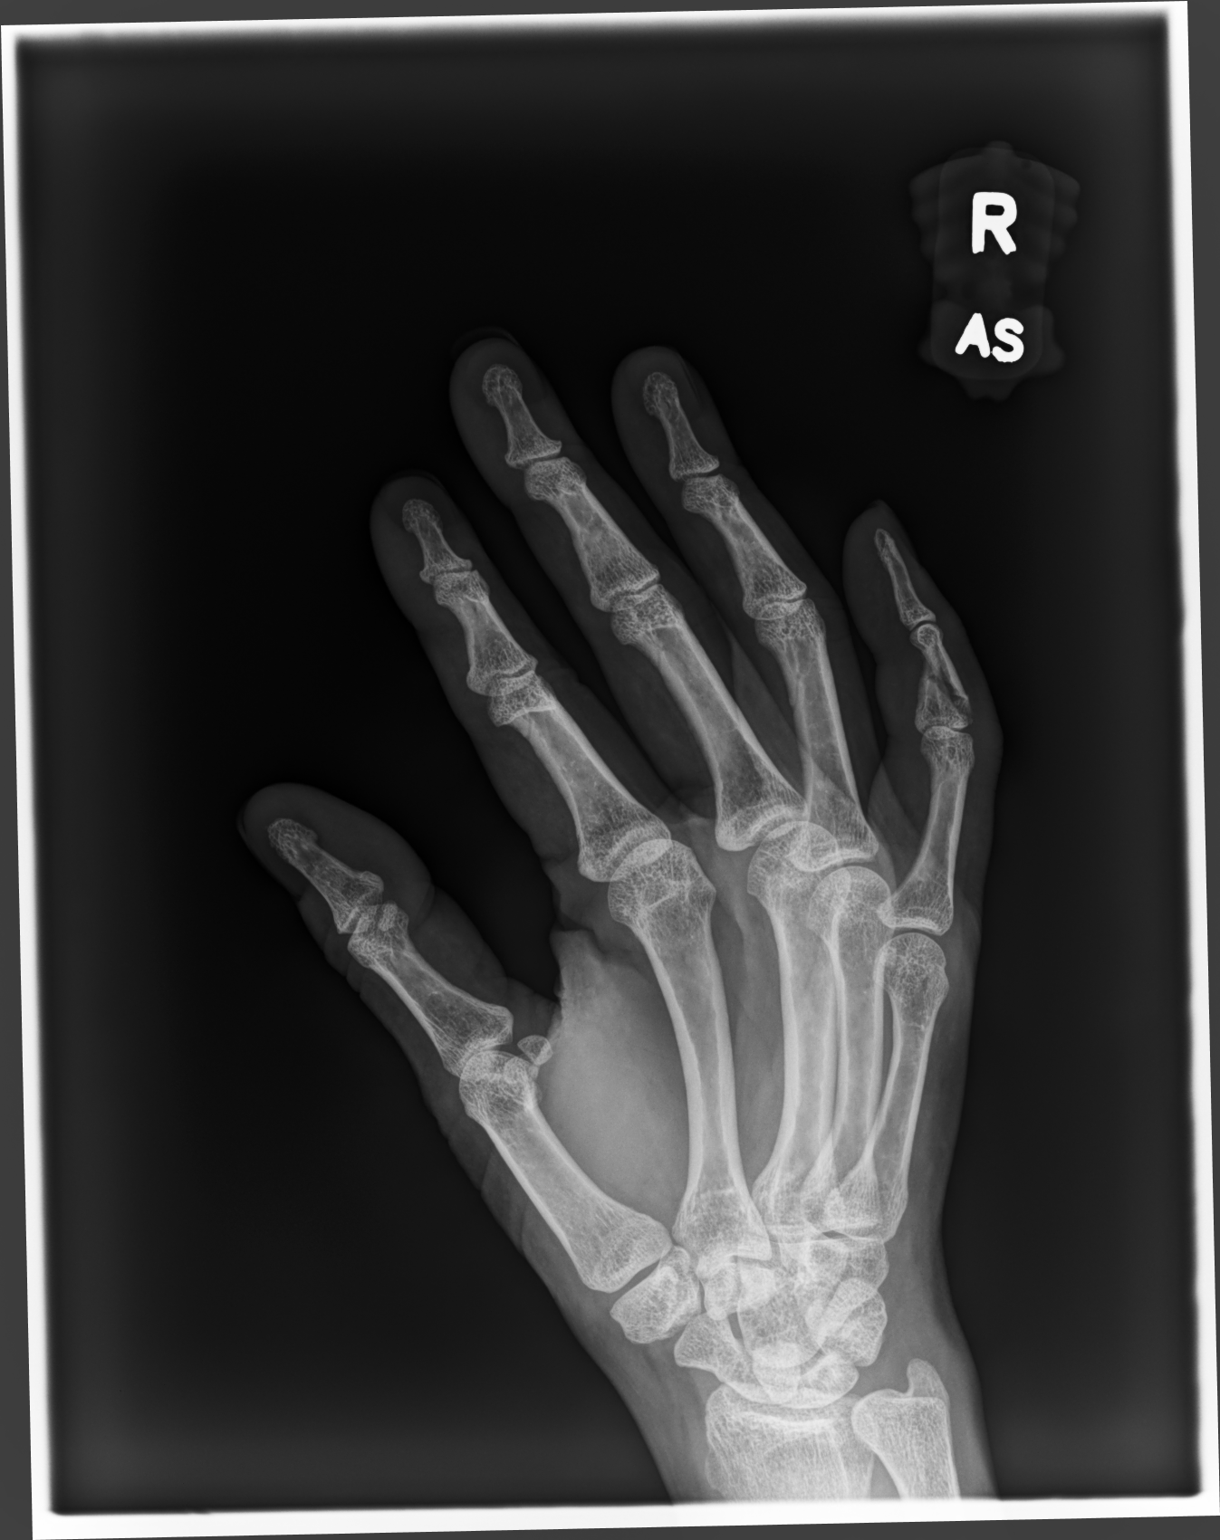

[hand lat]
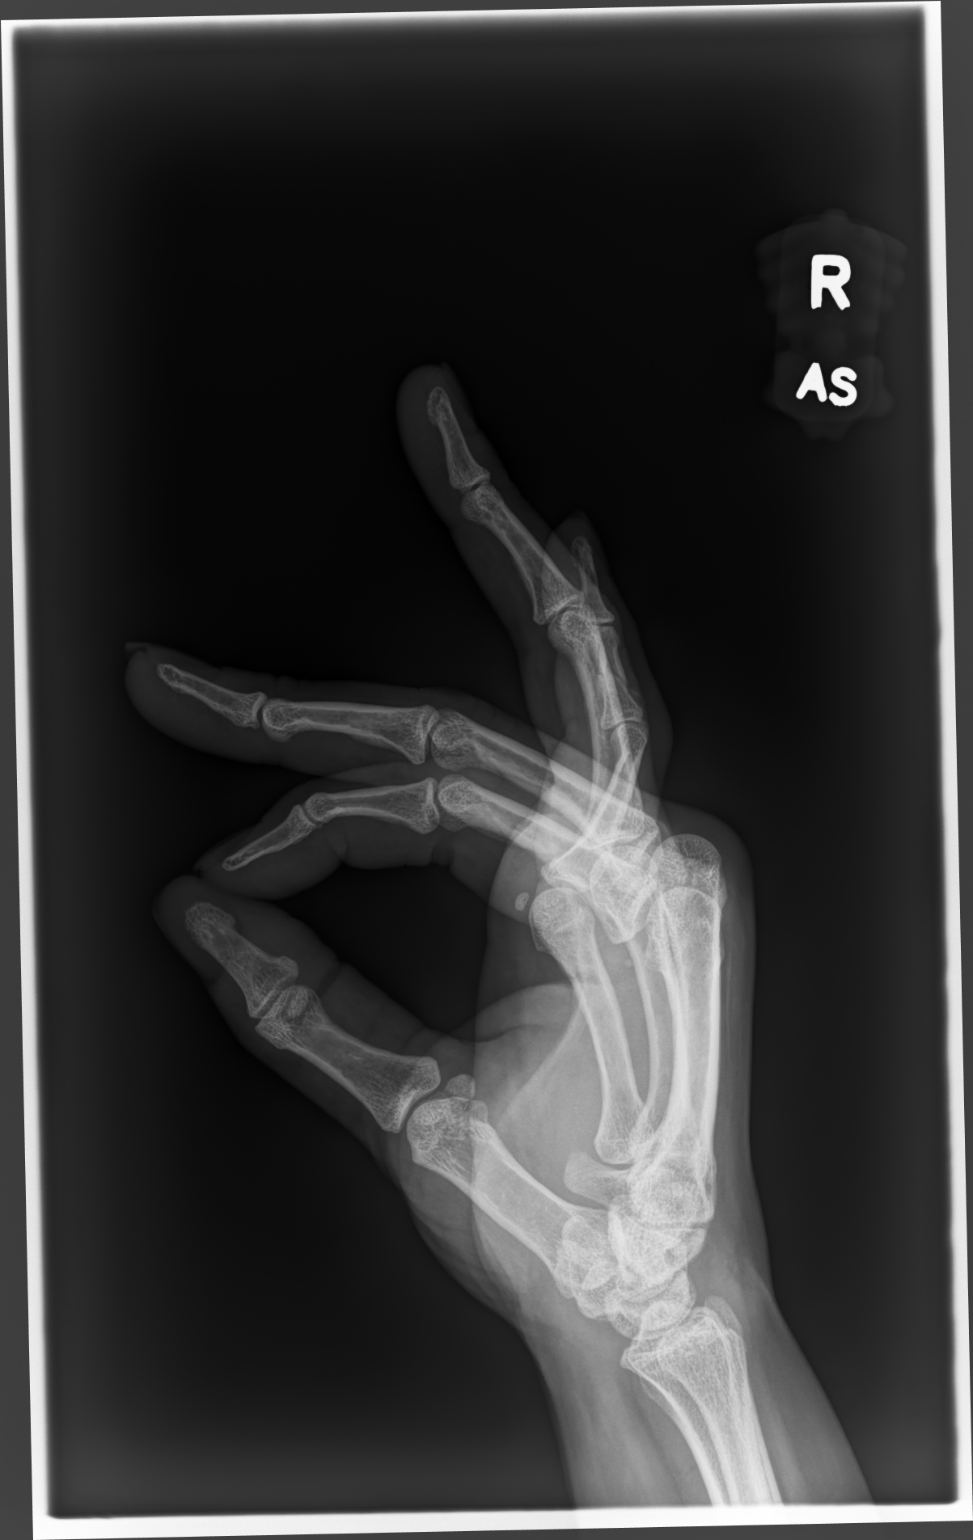

[3 of 3 positions shown; findings below may reference images not displayed]

FINDINGS: There is a fracture through the middle phalanx of the right little
finger. Mild displacement and angulation. This likely extends into
the PIP joint. No subluxation or dislocation.
IMPRESSION: Mildly displaced and angulated right little finger middle phalangeal
fracture.

## 2022-01-16 NOTE — Telephone Encounter (Signed)
No response to my chart message for appointment or phone messages for appointment . Sent twice and called twice.

## 2022-02-07 ENCOUNTER — Other Ambulatory Visit: Payer: Self-pay | Admitting: Family Medicine

## 2022-02-22 ENCOUNTER — Other Ambulatory Visit: Payer: Self-pay | Admitting: Obstetrics & Gynecology

## 2022-07-07 ENCOUNTER — Encounter: Payer: Self-pay | Admitting: Family Medicine

## 2022-07-07 ENCOUNTER — Ambulatory Visit: Payer: BC Managed Care – PPO | Admitting: Family Medicine

## 2022-07-07 VITALS — BP 138/96 | HR 120 | Wt 180.8 lb

## 2022-07-07 DIAGNOSIS — I10 Essential (primary) hypertension: Secondary | ICD-10-CM | POA: Diagnosis not present

## 2022-07-07 DIAGNOSIS — D509 Iron deficiency anemia, unspecified: Secondary | ICD-10-CM

## 2022-07-07 DIAGNOSIS — R6889 Other general symptoms and signs: Secondary | ICD-10-CM | POA: Diagnosis not present

## 2022-07-07 DIAGNOSIS — E559 Vitamin D deficiency, unspecified: Secondary | ICD-10-CM

## 2022-07-07 DIAGNOSIS — Z13220 Encounter for screening for lipoid disorders: Secondary | ICD-10-CM

## 2022-07-07 MED ORDER — AMLODIPINE BESYLATE 5 MG PO TABS: ORAL_TABLET | ORAL | 5 refills | Status: DC

## 2022-07-07 MED ORDER — CHLORTHALIDONE 50 MG PO TABS: ORAL_TABLET | ORAL | 5 refills | Status: AC
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
## 2022-07-09 ENCOUNTER — Telehealth: Payer: Self-pay | Admitting: Family Medicine

## 2022-07-09 DIAGNOSIS — N289 Disorder of kidney and ureter, unspecified: Secondary | ICD-10-CM

## 2022-07-09 LAB — CBC WITH DIFFERENTIAL/PLATELET
Basophils Absolute: 0 10*3/uL (ref 0.0–0.2)
Basos: 0 %
EOS (ABSOLUTE): 0.2 10*3/uL (ref 0.0–0.4)
Eos: 3 %
Hematocrit: 48.9 % — ABNORMAL HIGH (ref 34.0–46.6)
Hemoglobin: 16.6 g/dL — ABNORMAL HIGH (ref 11.1–15.9)
Immature Grans (Abs): 0 10*3/uL (ref 0.0–0.1)
Immature Granulocytes: 0 %
Lymphocytes Absolute: 1.9 10*3/uL (ref 0.7–3.1)
Lymphs: 31 %
MCH: 35.2 pg — ABNORMAL HIGH (ref 26.6–33.0)
MCHC: 33.9 g/dL (ref 31.5–35.7)
MCV: 104 fL — ABNORMAL HIGH (ref 79–97)
Monocytes Absolute: 0.7 10*3/uL (ref 0.1–0.9)
Monocytes: 11 %
Neutrophils Absolute: 3.4 10*3/uL (ref 1.4–7.0)
Neutrophils: 55 %
Platelets: 320 10*3/uL (ref 150–450)
RBC: 4.72 x10E6/uL (ref 3.77–5.28)
RDW: 12.9 % (ref 11.7–15.4)
WBC: 6.2 10*3/uL (ref 3.4–10.8)

## 2022-07-09 LAB — TSH+FREE T4
Free T4: 1.14 ng/dL (ref 0.82–1.77)
TSH: 1.11 u[IU]/mL (ref 0.450–4.500)

## 2022-07-09 LAB — BASIC METABOLIC PANEL (7)
BUN/Creatinine Ratio: 9 (ref 9–23)
BUN: 14 mg/dL (ref 6–24)
CO2: 22 mmol/L (ref 20–29)
Chloride: 99 mmol/L (ref 96–106)
Creatinine, Ser: 1.56 mg/dL — ABNORMAL HIGH (ref 0.57–1.00)
Glucose: 80 mg/dL (ref 70–99)
Potassium: 4 mmol/L (ref 3.5–5.2)
Sodium: 140 mmol/L (ref 134–144)
eGFR: 42 mL/min/{1.73_m2} — ABNORMAL LOW (ref >59)

## 2022-07-09 LAB — LIPID PANEL
Chol/HDL Ratio: 3 ratio (ref 0.0–4.4)
Cholesterol, Total: 237 mg/dL — ABNORMAL HIGH (ref 100–199)
HDL: 79 mg/dL (ref >39)
LDL Chol Calc (NIH): 135 mg/dL — ABNORMAL HIGH (ref 0–99)
Triglycerides: 135 mg/dL (ref 0–149)
VLDL Cholesterol Cal: 23 mg/dL (ref 5–40)

## 2022-07-09 LAB — MICROALBUMIN / CREATININE URINE RATIO
Creatinine, Urine: 408.9 mg/dL
Microalb/Creat Ratio: 228 mg/g creat — ABNORMAL HIGH (ref 0–29)
Microalbumin, Urine: 930.4 ug/mL

## 2022-07-09 LAB — VITAMIN D 25 HYDROXY (VIT D DEFICIENCY, FRACTURES): Vit D, 25-Hydroxy: <4 ng/mL — ABNORMAL LOW (ref 30.0–100.0)

## 2022-07-09 NOTE — Telephone Encounter (Signed)
Please advise, patient requesting lab results.

## 2022-07-09 NOTE — Telephone Encounter (Signed)
Patient would like results of recents.She saw them in my chart and didn't understand them [phone].

## 2022-07-10 ENCOUNTER — Encounter: Payer: Self-pay | Admitting: Radiology

## 2022-07-10 NOTE — Telephone Encounter (Signed)
Please see lab results She will need to do a repeat metabolic 7 somewhere within the course of the next 7 to 10 days It is important first to stop chlorthalidone and potassium. Very important to do the metabolic 7 in the middle of the day when she is well-hydrated (essentially this could be midmorning mid afternoon or late afternoon-not first thing in the morning-hand on the day she gets it drawn make sure that she has kept herself well-hydrated to get a accurate picture of the GFR) It is quite possible we will need to get nephrology involved depending on the results of this test Having blood pressure issues can affect creatinine but sometimes diuretics can make it worse that is why we are stopping the diuretic and having her repeat the blood work again in 7 to 10 days Also after we do the blood work may or may not have to get nephrology involved plus also may have to move her appointment up to follow-up on blood pressure

## 2022-07-10 NOTE — Telephone Encounter (Signed)
Left message for patient to return the call for additional details and recommendations.   

## 2022-07-11 NOTE — Telephone Encounter (Signed)
Results discussed with patient. Patient advised per Dr Nicki Reaper:  reviewed over the test results-I recommend stopping chlorthalidone.  Also stop potassium.  Also recommend re-looking at blood work elevated kidney function could be related to the diuretic.  But it can also be related to blood pressure issues.  So in these cases we recommend stopping the above medications.  Then repeating a metabolic 7 in approximately 7 to 10 days in the middle of the day when well-hydrated to get a accurate picture of kidney function-in other words make sure she is well-hydrated when she goes to get her blood drawn.  If creatinine is still significantly elevated then the next step will be to help set her up with a nephrologist.  Also vitamin D level is low I recommend vitamin D 50,000 units once per week #4 with 2 refills.  Cholesterol moderately elevated but good cholesterol is in good range.  Urine does show some protein in the urine-this will need further looking into as well but it is important to make the medication changes above then repeat metabolic 7 to get a true reflection of kidney function. Patient verbalized understanding. Blood work ordered in [facility]

## 2022-07-19 LAB — BASIC METABOLIC PANEL
BUN/Creatinine Ratio: 9 (ref 9–23)
BUN: 10 mg/dL (ref 6–24)
CO2: 20 mmol/L (ref 20–29)
Calcium: 8.8 mg/dL (ref 8.7–10.2)
Chloride: 106 mmol/L (ref 96–106)
Creatinine, Ser: 1.11 mg/dL — ABNORMAL HIGH (ref 0.57–1.00)
Glucose: 93 mg/dL (ref 70–99)
Potassium: 4.1 mmol/L (ref 3.5–5.2)
Sodium: 142 mmol/L (ref 134–144)
eGFR: 63 mL/min/{1.73_m2} (ref >59)

## 2022-07-29 ENCOUNTER — Telehealth: Payer: Self-pay

## 2022-07-29 DIAGNOSIS — D7589 Other specified diseases of blood and blood-forming organs: Secondary | ICD-10-CM

## 2022-07-29 DIAGNOSIS — I10 Essential (primary) hypertension: Secondary | ICD-10-CM

## 2022-07-29 DIAGNOSIS — R6889 Other general symptoms and signs: Secondary | ICD-10-CM

## 2022-07-29 DIAGNOSIS — D509 Iron deficiency anemia, unspecified: Secondary | ICD-10-CM

## 2022-07-29 NOTE — Telephone Encounter (Signed)
I would recommend CBC, TIBC, ferritin, B12, TSH, free T4 Iron deficient anemia, macrocytosis, hypertension, cold intolerance

## 2022-07-29 NOTE — Telephone Encounter (Signed)
Patient called and stated that she is concerned about her iron level. She is still cold all the time and cannot seem to get warm. She also states her blood pressure machine is not working so she has been set up for nurse visits to monitor her BP every 3 weeks per Dr Bary Leriche request.

## 2022-07-30 ENCOUNTER — Ambulatory Visit (INDEPENDENT_AMBULATORY_CARE_PROVIDER_SITE_OTHER): Payer: BC Managed Care – PPO | Admitting: Family Medicine

## 2022-07-30 VITALS — BP 151/109 | HR 111

## 2022-07-30 DIAGNOSIS — I10 Essential (primary) hypertension: Secondary | ICD-10-CM | POA: Diagnosis not present

## 2022-07-30 NOTE — Progress Notes (Unsigned)
Patient's BP is elevated consulted with Dr Nicki Reaper. Per Dr Nicki Reaper advised patient he is aware of her blood pressure and one of the nurses/CMA will contact her with  adjustments to her medication. She will need to schedule a follow up appointment. Patient verbalized understanding.   We will add low-dose Coreg 3.125 take 1 twice daily, #60, 3 refills Nurse visit within 2 weeks to recheck blood pressure Keep follow-up with [place] in June sooner problems

## 2022-07-30 NOTE — Telephone Encounter (Signed)
Labs ordered in Epic per Dr Bary Leriche request.

## 2022-07-30 NOTE — Telephone Encounter (Signed)
Patient notified via My Chart

## 2022-07-31 ENCOUNTER — Telehealth: Payer: Self-pay | Admitting: Family Medicine

## 2022-07-31 NOTE — Telephone Encounter (Signed)
Sorry-spironolactone was from a previous message that I was typing and was meant to be deleted I recommend Coreg 3.125, take 1 twice daily, #60 with 3 refills Do not send in spironolactone

## 2022-07-31 NOTE — Telephone Encounter (Signed)
After discussion with nurse is felt best to add low-dose spironolactone.  Patient cannot tolerate lisinopril.  Also higher dose amlodipine she had pedal edema.  Plus concerned that increased dose of chlorthalidone could cause low potassium.    Therefore we will add to what she is taking 3.125 Coreg 1 twice daily, #60, 3 refills Please send in this medicine  Also go ahead and have the patient do a follow-up visit for a nurse blood pressure check within 2 weeks  Also keep follow-up visit in June but we may be adjusting pain further depending on the results of all of this

## 2022-07-31 NOTE — Telephone Encounter (Signed)
After discussion with nurse is felt best to add low-dose spironolactone.  Patient cannot tolerate lisinopril.  Also higher dose amlodipine she had pedal edema.  Plus concerned that increased dose of chlorthalidone could cause low potassium.     Therefore we will add to what she is taking 3.125 Coreg 1 twice daily, #60, 3 refills Please send in this medicine     Dr Nicki Reaper can you please clarify, Did you want me to send in the spironolactone or the Coreg?

## 2022-08-01 MED ORDER — CARVEDILOL 3.125 MG PO TABS: 3.1250 mg | ORAL_TABLET | Freq: Two times a day (BID) | ORAL | 3 refills | Status: DC

## 2022-08-01 NOTE — Telephone Encounter (Signed)
Rx has been sent to pharmacy and BP check for nurse visit.

## 2022-08-01 NOTE — Telephone Encounter (Signed)
Called and left message for patient to call office.

## 2022-08-01 NOTE — Telephone Encounter (Signed)
Spoke with patient advised per Dr Nicki Reaper Therefore we will add to what she is taking 3.125 Coreg 1 twice daily, #60, 3 refills Please send in this medicine    Rx has been sent in to pharmacy and nurse visit scheduled for 2 weeks patient verbalized understanding.

## 2022-08-15 ENCOUNTER — Ambulatory Visit: Payer: BC Managed Care – PPO

## 2022-08-19 ENCOUNTER — Ambulatory Visit: Payer: BC Managed Care – PPO

## 2022-08-26 ENCOUNTER — Ambulatory Visit (INDEPENDENT_AMBULATORY_CARE_PROVIDER_SITE_OTHER): Payer: BC Managed Care – PPO | Admitting: *Deleted

## 2022-08-26 ENCOUNTER — Telehealth: Payer: Self-pay | Admitting: *Deleted

## 2022-08-26 VITALS — BP 136/84

## 2022-08-26 DIAGNOSIS — I10 Essential (primary) hypertension: Secondary | ICD-10-CM | POA: Diagnosis not present

## 2022-08-26 NOTE — Telephone Encounter (Signed)
Patient arrives for a Nurse visit to re check Blood pressure today and her BP was 136/84. Patient states she is not sure if it is from the medication but her left foot is swelling a lot by the end of the day. Patient states she is on her feet all day long

## 2022-08-26 NOTE — Telephone Encounter (Signed)
Amlodipine can cause vasodilation which can cause swelling in the ankles. Compression hose or socks can help If becoming excessive we can make adjustments BUT patient is allergic to lisinopril so can not use ACE or ARB and already on max dose of hygroton Follow up if problematic Also follow up OV by Sept

## 2022-08-28 NOTE — Telephone Encounter (Signed)
Patient called and advised per Dr Lorin Picket  Amlodipine can cause vasodilation which can cause swelling in the ankles. Compression hose or socks can help If becoming excessive we can make adjustments BUT patient is allergic to lisinopril so can not use ACE or ARB and already on max dose of hygroton Follow up if problematic Also follow up OV by Sept. Patient verbalized understanding.

## 2022-09-01 ENCOUNTER — Encounter (HOSPITAL_COMMUNITY): Payer: Self-pay | Admitting: Internal Medicine

## 2022-09-18 ENCOUNTER — Telehealth: Payer: BC Managed Care – PPO | Admitting: Nurse Practitioner

## 2022-09-18 DIAGNOSIS — J039 Acute tonsillitis, unspecified: Secondary | ICD-10-CM

## 2022-09-18 MED ORDER — PREDNISONE 5 MG/5ML PO SOLN: 20.0000 mg | Freq: Two times a day (BID) | ORAL | 0 refills | Status: AC

## 2022-09-18 MED ORDER — CLINDAMYCIN HCL 300 MG PO CAPS: 300.0000 mg | ORAL_CAPSULE | Freq: Three times a day (TID) | ORAL | 0 refills | Status: AC

## 2022-09-18 NOTE — Progress Notes (Signed)
Virtual Visit Consent   April Finley, you are scheduled for a virtual visit with a [facility] provider today. Just as with appointments in the office, your consent must be obtained to participate. Your consent will be active for this visit and any virtual visit you may have with one of our providers in the next 365 days. If you have a MyChart account, a copy of this consent can be sent to you electronically.  As this is a virtual visit, video technology does not allow for your provider to perform a traditional examination. This may limit your provider's ability to fully assess your condition. If your provider identifies any concerns that need to be evaluated in person or the need to arrange testing (such as labs, EKG, etc.), we will make arrangements to do so. Although advances in technology are sophisticated, we cannot ensure that it will always work on either your end or our end. If the connection with a video visit is poor, the visit may have to be switched to a telephone visit. With either a video or telephone visit, we are not always able to ensure that we have a secure connection.  By engaging in this virtual visit, you consent to the provision of healthcare and authorize for your insurance to be billed (if applicable) for the services provided during this visit. Depending on your insurance coverage, you may receive a charge related to this service.  I need to obtain your verbal consent now. Are you willing to proceed with your visit today? April Finley has provided verbal consent on 09/18/2022 for a virtual visit (video or telephone). Viviano Simas, FNP  Date: 09/18/2022 5:39 PM  Virtual Visit via Video Note   I, Viviano Simas, connected with  [name]  ([id number], May 10, 1976) on 09/18/22 at  5:45 PM EDT by a video-enabled telemedicine application and verified that I am speaking with the correct person using two identifiers.  Location: Patient: Virtual Visit Location Patient:  Home Provider: Virtual Visit Location Provider: Home Office   I discussed the limitations of evaluation and management by telemedicine and the availability of in person appointments. The patient expressed understanding and agreed to proceed.    History of Present Illness: April Finley is a 47 y.o. who identifies as a female who was assigned female at birth, and is being seen today for sore throat.  Symptom onset was an itchy throat  She thought initially it was allergies and she started taking Claritin   Her ST has progressed and it is hard to swallow anything   Denies fever or chills   She feels it is difficult to to open her mouth the entire way  She feels her lymph nodes are swollen   She has been able to eat noodles and pasta today   She has been taking tylenol and that does reduce the pain   Denies any new medications   Denies any swelling in lips or tongue  No difficulty breathing   Problems:  Patient Active Problem List   Diagnosis Date Noted   Menorrhagia 02/10/2018   Chronic iron deficiency anemia 09/10/2017   HTN (hypertension), benign 06/01/2013    Allergies:  Allergies  Allergen Reactions   Penicillins Swelling and Rash    Has patient had a PCN reaction causing immediate rash, facial/tongue/throat swelling, SOB or lightheadedness with hypotension: no Has patient had a PCN reaction causing severe rash involving mucus membranes or skin necrosis: No Has patient had a PCN reaction  that required hospitalization No Has patient had a PCN reaction occurring within the last 10 years: No If all of the above answers are "NO", then may proceed with Cephalosporin use.    Lisinopril     Had angioedema (was also on amlodipine at the time)   Medications:  Current Outpatient Medications:    amLODipine (NORVASC) 5 MG tablet, 1 qd, Disp: 30 tablet, Rfl: 5   carvedilol (COREG) 3.125 MG tablet, Take 1 tablet (3.125 mg total) by mouth 2 (two) times daily with a meal., Disp:  60 tablet, Rfl: 3   chlorthalidone (HYGROTON) 50 MG tablet, 1/2 qd, Disp: 15 tablet, Rfl: 5   hydrOXYzine (ATARAX/VISTARIL) 25 MG tablet, Take 1 tablet (25 mg total) by mouth every 8 (eight) hours as needed for anxiety or itching. (Patient not taking: Reported on 07/07/2022), Disp: 20 tablet, Rfl: 0   megestrol (MEGACE) 40 MG tablet, TAKE 3 TABLETS DAILY FOR 5 DAYS, 2 DAILY FOR 5 DAYS, THEN 1 DAILY TO HELP CONTROL VAGINAL BLEEDING, Disp: 135 tablet, Rfl: 1   potassium chloride SA (KLOR-CON M20) 20 MEQ tablet, TAKE 1 TABLET BY MOUTH IN THE MORNING (APPT REQUIRED), Disp: 15 tablet, Rfl: 0 No current facility-administered medications for this visit.  Facility-Administered Medications Ordered in Other Visits:    0.9 %  sodium chloride infusion, , Intravenous, Continuous, Doreatha Massed, MD, Last Rate: 100 mL/hr at 05/14/18 1600, Rate Verify at 05/14/18 1600  Observations/Objective: Patient is well-developed, well-nourished in no acute distress.  Resting comfortably  at home.  Head is normocephalic, atraumatic.  No labored breathing.  Speech is clear and coherent with logical content.  Patient is alert and oriented at baseline.    Assessment and Plan: 1. Tonsillitis Push fluids monitor hydration   - clindamycin (CLEOCIN) 300 MG capsule; Take 1 capsule (300 mg total) by mouth 3 (three) times daily for 10 days.  Dispense: 30 capsule; Refill: 0 - predniSONE 5 MG/5ML solution; Take 20 mLs (20 mg total) by mouth 2 (two) times daily with a meal for 5 days.  Dispense: 200 mL; Refill: 0    May continue tylenol for pain relief  Follow Up Instructions: I discussed the assessment and treatment plan with the patient. The patient was provided an opportunity to ask questions and all were answered. The patient agreed with the plan and demonstrated an understanding of the instructions.  A copy of instructions were sent to the patient via MyChart unless otherwise noted below.    The patient was advised  to call back or seek an in-person evaluation if the symptoms worsen or if the condition fails to improve as anticipated.  Time:  I spent 15 minutes with the patient via telehealth technology discussing the above problems/concerns.    Viviano Simas, FNP  ed your recovery.

## 2022-10-04 ENCOUNTER — Telehealth: Payer: BC Managed Care – PPO | Admitting: Family Medicine

## 2022-10-04 DIAGNOSIS — L259 Unspecified contact dermatitis, unspecified cause: Secondary | ICD-10-CM | POA: Diagnosis not present

## 2022-10-04 MED ORDER — PREDNISONE 10 MG (21) PO TBPK: ORAL_TABLET | ORAL | 0 refills | Status: DC

## 2022-10-04 NOTE — Progress Notes (Signed)
Virtual Visit Consent   April Finley, you are scheduled for a virtual visit with a [facility] provider today. Just as with appointments in the office, your consent must be obtained to participate. Your consent will be active for this visit and any virtual visit you may have with one of our providers in the next 365 days. If you have a MyChart account, a copy of this consent can be sent to you electronically.  As this is a virtual visit, video technology does not allow for your provider to perform a traditional examination. This may limit your provider's ability to fully assess your condition. If your provider identifies any concerns that need to be evaluated in person or the need to arrange testing (such as labs, EKG, etc.), we will make arrangements to do so. Although advances in technology are sophisticated, we cannot ensure that it will always work on either your end or our end. If the connection with a video visit is poor, the visit may have to be switched to a telephone visit. With either a video or telephone visit, we are not always able to ensure that we have a secure connection.  By engaging in this virtual visit, you consent to the provision of healthcare and authorize for your insurance to be billed (if applicable) for the services provided during this visit. Depending on your insurance coverage, you may receive a charge related to this service.  I need to obtain your verbal consent now. Are you willing to proceed with your visit today? Cai A Werman has provided verbal consent on 10/04/2022 for a virtual visit (video or telephone). Georgana Curio, FNP  Date: 10/04/2022 10:43 AM  Virtual Visit via Video Note   I, Georgana Curio, connected with  MERRIN ROSS  ([id number], Sep 25, 1975) on 10/04/22 at 11:15 AM EDT by a video-enabled telemedicine application and verified that I am speaking with the correct person using two identifiers.  Location: Patient: Virtual Visit Location Patient:  Home Provider: Virtual Visit Location Provider: Home Office   I discussed the limitations of evaluation and management by telemedicine and the availability of in person appointments. The patient expressed understanding and agreed to proceed.    History of Present Illness: April Finley is a 47 y.o. who identifies as a female who was assigned female at birth, and is being seen today for rash that itches on both arms and leg for a week. Unknown cause. Itches intensely. Marland Kitchen  HPI: HPI  Problems:  Patient Active Problem List   Diagnosis Date Noted   Menorrhagia 02/10/2018   Chronic iron deficiency anemia 09/10/2017   HTN (hypertension), benign 06/01/2013    Allergies:  Allergies  Allergen Reactions   Penicillins Swelling and Rash    Has patient had a PCN reaction causing immediate rash, facial/tongue/throat swelling, SOB or lightheadedness with hypotension: no Has patient had a PCN reaction causing severe rash involving mucus membranes or skin necrosis: No Has patient had a PCN reaction that required hospitalization No Has patient had a PCN reaction occurring within the last 10 years: No If all of the above answers are "NO", then may proceed with Cephalosporin use.    Lisinopril     Had angioedema (was also on amlodipine at the time)   Medications:  Current Outpatient Medications:    predniSONE (STERAPRED UNI-PAK 21 TAB) 10 MG (21) TBPK tablet, 10 mg--6 day dose pack, Disp: 1 each, Rfl: 0   amLODipine (NORVASC) 5 MG tablet, 1 qd, Disp: 30 tablet,  Rfl: 5   carvedilol (COREG) 3.125 MG tablet, Take 1 tablet (3.125 mg total) by mouth 2 (two) times daily with a meal., Disp: 60 tablet, Rfl: 3   chlorthalidone (HYGROTON) 50 MG tablet, 1/2 qd, Disp: 15 tablet, Rfl: 5   hydrOXYzine (ATARAX/VISTARIL) 25 MG tablet, Take 1 tablet (25 mg total) by mouth every 8 (eight) hours as needed for anxiety or itching. (Patient not taking: Reported on 07/07/2022), Disp: 20 tablet, Rfl: 0   megestrol (MEGACE) 40  MG tablet, TAKE 3 TABLETS DAILY FOR 5 DAYS, 2 DAILY FOR 5 DAYS, THEN 1 DAILY TO HELP CONTROL VAGINAL BLEEDING, Disp: 135 tablet, Rfl: 1   potassium chloride SA (KLOR-CON M20) 20 MEQ tablet, TAKE 1 TABLET BY MOUTH IN THE MORNING (APPT REQUIRED), Disp: 15 tablet, Rfl: 0 No current facility-administered medications for this visit.  Facility-Administered Medications Ordered in Other Visits:    0.9 %  sodium chloride infusion, , Intravenous, Continuous, Doreatha Massed, MD, Last Rate: 100 mL/hr at 05/14/18 1600, Rate Verify at 05/14/18 1600  Observations/Objective: Patient is well-developed, well-nourished in no acute distress.  Resting comfortably  at home.  Head is normocephalic, atraumatic.  No labored breathing.  Speech is clear and coherent with logical content.  Patient is alert and oriented at baseline.    Assessment and Plan: 1. Contact dermatitis, unspecified contact dermatitis type, unspecified trigger  Benadryl, hydrocortisone, UC if sx worsen.   Follow Up Instructions: I discussed the assessment and treatment plan with the patient. The patient was provided an opportunity to ask questions and all were answered. The patient agreed with the plan and demonstrated an understanding of the instructions.  A copy of instructions were sent to the patient via MyChart unless otherwise noted below.     The patient was advised to call back or seek an in-person evaluation if the symptoms worsen or if the condition fails to improve as anticipated.  Time:  I spent 10 minutes with the patient via telehealth technology discussing the above problems/concerns.    Georgana Curio, FNP

## 2022-10-04 NOTE — Patient Instructions (Signed)
Contact Dermatitis Dermatitis is redness, soreness, and swelling (inflammation) of the skin. Contact dermatitis is a reaction to certain substances that touch the skin. There are two types of this condition: Irritant contact dermatitis. This is the most common type. It happens when something irritates your skin, such as when your hands get dry from washing them too often with soap. You can get this type of reaction even if you have not been exposed to the irritant before. Allergic contact dermatitis. This type is caused by a substance that you are allergic to, such as poison ivy. It occurs when you have been exposed to the substance (allergen) and form a sensitivity to it. In some cases, the reaction may start soon after your first exposure to the allergen. In other cases, it may not start until you are exposed to the allergen again. It may then occur every time you are exposed to the allergen in the future. What are the causes? Irritant contact dermatitis is often caused by exposure to: Makeup. Soaps, detergents, and bleaches. Acids. Metal salts, such as nickel. Allergic contact dermatitis is often caused by exposure to: Poisonous plants. Chemicals. Jewelry. Latex. Medicines. Preservatives in products, such as clothes. What increases the risk? You are more likely to get this condition if you have: A job that exposes you to irritants or allergens. Certain medical conditions. These include asthma and eczema. What are the signs or symptoms? Symptoms of this condition may occur in any place on your body that has been touched by the irritant. Symptoms include: Dryness, flaking, or cracking. Redness. Itching. Pain or a burning feeling. Blisters. Drainage of small amounts of blood or clear fluid from skin cracks. With allergic contact dermatitis, there may also be swelling in areas such as the eyelids, mouth, or genitals. How is this diagnosed? This condition is diagnosed with a medical  history and physical exam. A patch skin test may be done to help figure out the cause. If the condition is related to your job, you may need to see an expert in health problems in the workplace (occupational medicine specialist). How is this treated? This condition is treated by staying away from the cause of the reaction and protecting your skin from further contact. Treatment may also include: Steroid creams or ointments. Steroid medicines may need be taken by mouth (orally) in more severe cases. Antibiotics or medicines applied to the skin to kill bacteria (antibacterial ointments). These may be needed if a skin infection is present. Antihistamines. These may be taken orally or put on as a lotion to ease itching. A bandage (dressing). Follow these instructions at home: Skin care Moisturize your skin as needed. Put cool, wet cloths (cool compresses) on the affected areas. Try applying baking soda paste to your skin. Stir water into baking soda until it has the consistency of a paste. Do not scratch your skin. Avoid friction to the affected area. Avoid the use of soaps, perfumes, and dyes. Check the affected areas every day for signs of infection. Check for: More redness, swelling, or pain. More fluid or blood. Warmth. Pus or a bad smell. Medicines Take or apply over-the-counter and prescription medicines only as told by your health care provider. If you were prescribed antibiotics, take or apply them as told by your health care provider. Do not stop using the antibiotic even if you start to feel better. Bathing Try taking a bath with: Epsom salts. Follow the instructions on the packaging. You can get these at your local pharmacy   or grocery store. Baking soda. Pour a small amount into the bath as told by your health care provider. Colloidal oatmeal. Follow the instructions on the packaging. You can get this at your local pharmacy or grocery store. Bathe less often. This may mean bathing  every other day. Bathe in lukewarm water. Avoid using hot water. Bandage care If you were given a dressing, change it as told by your health care provider. Wash your hands with soap and water for at least 20 seconds before and after you change your dressing. If soap and water are not available, use hand sanitizer. General instructions Avoid the substance that caused your reaction. If you do not know what caused it, keep a journal to try to track what caused it. Write down: What you eat and drink. What cosmetics you use. What you wear in the affected area. This includes jewelry. Contact a health care provider if: Your condition does not get better with treatment. Your condition gets worse. You have any signs of infection. You have a fever. You have new symptoms. Your bone or joint under the affected area becomes painful after the skin has healed. Get help right away if: You notice red streaks coming from the affected area. The affected area turns darker. You have trouble breathing. This information is not intended to replace advice given to you by your health care provider. Make sure you discuss any questions you have with your health care provider. Document Revised: 11/01/2021 Document Reviewed: 11/01/2021 Elsevier Patient Education  2024 [employer]  

## 2022-10-18 ENCOUNTER — Telehealth: Payer: BC Managed Care – PPO | Admitting: Nurse Practitioner

## 2022-10-18 DIAGNOSIS — R21 Rash and other nonspecific skin eruption: Secondary | ICD-10-CM | POA: Diagnosis not present

## 2022-10-18 MED ORDER — HYDROXYZINE HCL 25 MG PO TABS: 25.0000 mg | ORAL_TABLET | Freq: Three times a day (TID) | ORAL | 0 refills | Status: DC | PRN

## 2022-10-18 MED ORDER — TRIAMCINOLONE ACETONIDE 0.1 % EX CREA: 1.0000 | TOPICAL_CREAM | Freq: Two times a day (BID) | CUTANEOUS | 0 refills | Status: DC

## 2022-10-18 NOTE — Patient Instructions (Signed)
April Finley, thank you for joining April Pierini, FNP for today's virtual visit.  While this provider is not your primary care provider (PCP), if your PCP is located in our provider database this encounter information will be shared with them immediately following your visit.   A [facility] MyChart account gives you access to today's visit and all your visits, tests, and labs performed at [facility] " click here if you don't have a [facility] MyChart account or go to [email or web address]  Consent: (Patient) April Finley provided verbal consent for this virtual visit at the beginning of the encounter.  Current Medications:  Current Outpatient Medications:    triamcinolone cream (KENALOG) 0.1 %, Apply 1 Application topically 2 (two) times daily., Disp: 30 g, Rfl: 0   amLODipine (NORVASC) 5 MG tablet, 1 qd, Disp: 30 tablet, Rfl: 5   carvedilol (COREG) 3.125 MG tablet, Take 1 tablet (3.125 mg total) by mouth 2 (two) times daily with a meal., Disp: 60 tablet, Rfl: 3   chlorthalidone (HYGROTON) 50 MG tablet, 1/2 qd, Disp: 15 tablet, Rfl: 5   hydrOXYzine (ATARAX) 25 MG tablet, Take 1 tablet (25 mg total) by mouth every 8 (eight) hours as needed for anxiety or itching., Disp: 20 tablet, Rfl: 0   megestrol (MEGACE) 40 MG tablet, TAKE 3 TABLETS DAILY FOR 5 DAYS, 2 DAILY FOR 5 DAYS, THEN 1 DAILY TO HELP CONTROL VAGINAL BLEEDING, Disp: 135 tablet, Rfl: 1   potassium chloride SA (KLOR-CON M20) 20 MEQ tablet, TAKE 1 TABLET BY MOUTH IN THE MORNING (APPT REQUIRED), Disp: 15 tablet, Rfl: 0   predniSONE (STERAPRED UNI-PAK 21 TAB) 10 MG (21) TBPK tablet, 10 mg--6 day dose pack, Disp: 1 each, Rfl: 0 No current facility-administered medications for this visit.  Facility-Administered Medications Ordered in Other Visits:    0.9 %  sodium chloride infusion, , Intravenous, Continuous, Doreatha Massed, MD, Last Rate: 100 mL/hr at 05/14/18 1600, Rate Verify at 05/14/18 1600    Medications ordered in this encounter:  Meds ordered this encounter  Medications   hydrOXYzine (ATARAX) 25 MG tablet    Sig: Take 1 tablet (25 mg total) by mouth every 8 (eight) hours as needed for anxiety or itching.    Dispense:  20 tablet    Refill:  0    Order Specific Question:   Supervising Provider    Answer:   Merrilee Jansky [id number]   triamcinolone cream (KENALOG) 0.1 %    Sig: Apply 1 Application topically 2 (two) times daily.    Dispense:  30 g    Refill:  0    Order Specific Question:   Supervising Provider    Answer:   Merrilee Jansky [id number]     *If you need refills on other medications prior to your next appointment, please contact your pharmacy*  Follow-Up: Call back or seek an in-person evaluation if the symptoms worsen or if the condition fails to improve as anticipated.  [facility] [phone]  Other Instructions See PCP Monday if not improving Avoid scratching Cool compresses   If you have been instructed to have an in-person evaluation today at a local Urgent Care facility, please use the link below. It will take you to a list of all of our available [facility], including address, phone number and hours of operation. Please do not delay care.  [facility]  If you or a family member do not have a primary care  provider, use the link below to schedule a visit and establish care. When you choose a [facility] primary care physician or advanced practice provider, you gain a long-term partner in health. Find a Primary Care Provider  Learn more about [facility]'s in-office and virtual care options: [facility] - Get Care Now

## 2022-10-18 NOTE — Progress Notes (Signed)
Virtual Visit Consent   April Finley, you are scheduled for a virtual visit with Mary-Margaret Daphine Deutscher, FNP, a [facility] provider, today.     Just as with appointments in the office, your consent must be obtained to participate.  Your consent will be active for this visit and any virtual visit you may have with one of our providers in the next 365 days.     If you have a MyChart account, a copy of this consent can be sent to you electronically.  All virtual visits are billed to your insurance company just like a traditional visit in the office.    As this is a virtual visit, video technology does not allow for your provider to perform a traditional examination.  This may limit your provider's ability to fully assess your condition.  If your provider identifies any concerns that need to be evaluated in person or the need to arrange testing (such as labs, EKG, etc.), we will make arrangements to do so.     Although advances in technology are sophisticated, we cannot ensure that it will always work on either your end or our end.  If the connection with a video visit is poor, the visit may have to be switched to a telephone visit.  With either a video or telephone visit, we are not always able to ensure that we have a secure connection.     I need to obtain your verbal consent now.   Are you willing to proceed with your visit today? YES   April Finley has provided verbal consent on 10/18/2022 for a virtual visit (video or telephone).   Mary-Margaret Daphine Deutscher, FNP   Date: 10/18/2022 11:22 AM   Virtual Visit via Video Note   I, Mary-Margaret Daphine Deutscher, connected with April Finley ([id number], 1975-05-21) on 10/18/22 at 11:30 AM EDT by a video-enabled telemedicine application and verified that I am speaking with the correct person using two identifiers.  Location: Patient: Virtual Visit Location Patient: Home Provider: Virtual Visit Location Provider: Mobile   I discussed the limitations of  evaluation and management by telemedicine and the availability of in person appointments. The patient expressed understanding and agreed to proceed.    History of Present Illness: April Finley is a 47 y.o. who identifies as a female who was assigned female at birth, and is being seen today for rash unresolved.  HPI: Has had a rash for 2 weeks on arms legs and chest. Has spread slightly. Itches but no burning. No drainage. She has been treating it with prednisone 21 day dose pack and benadryl cream.    Review of Systems  Constitutional:  Negative for diaphoresis and weight loss.  Eyes:  Negative for blurred vision, double vision and pain.  Respiratory:  Negative for shortness of breath.   Cardiovascular:  Negative for chest pain, palpitations, orthopnea and leg swelling.  Gastrointestinal:  Negative for abdominal pain.  Skin:  Negative for rash.  Neurological:  Negative for dizziness, sensory change, loss of consciousness, weakness and headaches.  Endo/Heme/Allergies:  Negative for polydipsia. Does not bruise/bleed easily.  Psychiatric/Behavioral:  Negative for memory loss. The patient does not have insomnia.   All other systems reviewed and are negative.   Problems:  Patient Active Problem List   Diagnosis Date Noted   Menorrhagia 02/10/2018   Chronic iron deficiency anemia 09/10/2017   HTN (hypertension), benign 06/01/2013    Allergies:  Allergies  Allergen Reactions   Penicillins Swelling and Rash  Has patient had a PCN reaction causing immediate rash, facial/tongue/throat swelling, SOB or lightheadedness with hypotension: no Has patient had a PCN reaction causing severe rash involving mucus membranes or skin necrosis: No Has patient had a PCN reaction that required hospitalization No Has patient had a PCN reaction occurring within the last 10 years: No If all of the above answers are "NO", then may proceed with Cephalosporin use.    Lisinopril     Had angioedema (was also  on amlodipine at the time)   Medications:  Current Outpatient Medications:    amLODipine (NORVASC) 5 MG tablet, 1 qd, Disp: 30 tablet, Rfl: 5   carvedilol (COREG) 3.125 MG tablet, Take 1 tablet (3.125 mg total) by mouth 2 (two) times daily with a meal., Disp: 60 tablet, Rfl: 3   chlorthalidone (HYGROTON) 50 MG tablet, 1/2 qd, Disp: 15 tablet, Rfl: 5   hydrOXYzine (ATARAX/VISTARIL) 25 MG tablet, Take 1 tablet (25 mg total) by mouth every 8 (eight) hours as needed for anxiety or itching. (Patient not taking: Reported on 07/07/2022), Disp: 20 tablet, Rfl: 0   megestrol (MEGACE) 40 MG tablet, TAKE 3 TABLETS DAILY FOR 5 DAYS, 2 DAILY FOR 5 DAYS, THEN 1 DAILY TO HELP CONTROL VAGINAL BLEEDING, Disp: 135 tablet, Rfl: 1   potassium chloride SA (KLOR-CON M20) 20 MEQ tablet, TAKE 1 TABLET BY MOUTH IN THE MORNING (APPT REQUIRED), Disp: 15 tablet, Rfl: 0   predniSONE (STERAPRED UNI-PAK 21 TAB) 10 MG (21) TBPK tablet, 10 mg--6 day dose pack, Disp: 1 each, Rfl: 0 No current facility-administered medications for this visit.  Facility-Administered Medications Ordered in Other Visits:    0.9 %  sodium chloride infusion, , Intravenous, Continuous, Doreatha Massed, MD, Last Rate: 100 mL/hr at 05/14/18 1600, Rate Verify at 05/14/18 1600  Observations/Objective: Patient is well-developed, well-nourished in no acute distress.  Resting comfortably  at home.  Head is normocephalic, atraumatic.  No labored breathing.  Speech is clear and coherent with logical content.  Patient is alert and oriented at baseline.  Raised macular patchy rashes all over body- difficult to see clearly for proper diagnosis  Assessment and Plan:  April Finley in today with chief complaint of Rash   1. Rash See PCP Monday if not improving Avoid scratching Cool compresses  Meds ordered this encounter  Medications   hydrOXYzine (ATARAX) 25 MG tablet    Sig: Take 1 tablet (25 mg total) by mouth every 8 (eight) hours as needed  for anxiety or itching.    Dispense:  20 tablet    Refill:  0    Order Specific Question:   Supervising Provider    Answer:   Merrilee Jansky [id number]   triamcinolone cream (KENALOG) 0.1 %    Sig: Apply 1 Application topically 2 (two) times daily.    Dispense:  30 g    Refill:  0    Order Specific Question:   Supervising Provider    Answer:   Merrilee Jansky [id number]      Follow Up Instructions: I discussed the assessment and treatment plan with the patient. The patient was provided an opportunity to ask questions and all were answered. The patient agreed with the plan and demonstrated an understanding of the instructions.  A copy of instructions were sent to the patient via MyChart.  The patient was advised to call back or seek an in-person evaluation if the symptoms worsen or if the condition fails to improve as anticipated.  Time:  I spent 10 minutes with the patient via telehealth technology discussing the above problems/concerns.    Mary-Margaret Daphine Deutscher, FNP

## 2022-10-22 ENCOUNTER — Ambulatory Visit: Payer: BC Managed Care – PPO | Admitting: Nurse Practitioner

## 2022-10-22 VITALS — BP 162/115 | HR 111 | Temp 97.5°F | Ht 64.0 in | Wt 190.0 lb

## 2022-10-22 DIAGNOSIS — L209 Atopic dermatitis, unspecified: Secondary | ICD-10-CM

## 2022-10-22 MED ORDER — PREDNISONE 20 MG PO TABS: ORAL_TABLET | ORAL | 0 refills | Status: AC

## 2022-10-22 MED ORDER — HYDROXYZINE HCL 25 MG PO TABS: 25.0000 mg | ORAL_TABLET | Freq: Three times a day (TID) | ORAL | 0 refills | Status: DC | PRN

## 2022-10-22 MED ORDER — CLOBETASOL PROPIONATE 0.05 % EX CREA: 1.0000 | TOPICAL_CREAM | Freq: Two times a day (BID) | CUTANEOUS | 0 refills | Status: AC

## 2022-10-22 NOTE — Patient Instructions (Signed)
Cerave or Cetaphil  Pepcid OTC twice a day  Atopic Dermatitis Atopic dermatitis is a skin disorder that causes inflammation of the skin. It is marked by a red rash and itchy, dry, scaly skin. It is the most common type of eczema. Eczema is a group of skin conditions that cause the skin to become rough and swollen. This condition is generally worse during the cooler winter months and often improves during the warm summer months. Atopic dermatitis usually starts showing signs in infancy and can last through adulthood. This condition cannot be passed from one person to another (is not contagious). Atopic dermatitis may not always be present, but when it is, it is called a flare-up. What are the causes? The exact cause of this condition is not known. Flare-ups may be triggered by: Coming in contact with something that you are sensitive or allergic to (allergen). Stress. Certain foods. Extremely hot or cold weather. Harsh chemicals and soaps. Dry air. Chlorine. What increases the risk? This condition is more likely to develop in people who have a personal or family history of: Eczema. Allergies. Asthma. Hay fever. What are the signs or symptoms? Symptoms of this condition include: Dry, scaly skin. Red, itchy rash. Itchiness, which can be severe. This may occur before the skin rash. This can make sleeping difficult. Skin thickening and cracking that can occur over time. How is this diagnosed? This condition is diagnosed based on: Your symptoms. Your medical history. A physical exam. How is this treated? There is no cure for this condition, but symptoms can usually be controlled. Treatment focuses on: Controlling the itchiness and scratching. You may be given medicines, such as antihistamines or steroid creams. Limiting exposure to allergens. Recognizing situations that cause stress and developing a plan to manage stress. If your atopic dermatitis does not get better with medicines, or  if it is all over your body (widespread), a treatment using a specific type of light (phototherapy) may be used. Follow these instructions at home: Skin care  Keep your skin well moisturized. Doing this seals in moisture and helps to prevent dryness. Use unscented lotions that have petroleum in them. Avoid lotions that contain alcohol or water. They can dry the skin. Keep baths or showers short (less than 5 minutes) in warm water. Do not use hot water. Use mild, unscented cleansers for bathing. Avoid soap and bubble bath. Apply a moisturizer to your skin right after a bath or shower. Do not apply anything to your skin without checking with your health care provider. General instructions Take or apply over-the-counter and prescription medicines only as told by your health care provider. Dress in clothes made of cotton or cotton blends. Dress lightly because heat increases itchiness. When washing your clothes, rinse your clothes twice so all of the soap is removed. Avoid any triggers that can cause a flare-up. Keep your fingernails cut short. Avoid scratching. Scratching makes the rash and itchiness worse. A break in the skin from scratching could result in a skin infection (impetigo). Do not be around people who have cold sores or fever blisters. If you get the infection, it may cause your atopic dermatitis to worsen. Keep all follow-up visits. This is important. Contact a health care provider if: Your itchiness interferes with sleep. Your rash gets worse or is not better within one week of starting treatment. You have a fever. You have a rash flare-up after having contact with someone who has cold sores or fever blisters. Get help right away if:  You develop pus or soft yellow scabs in the rash area. Summary Atopic dermatitis causes a red rash and itchy, dry, scaly skin. Treatment focuses on controlling the itchiness and scratching, limiting exposure to things that you are sensitive or  allergic to (allergens), recognizing situations that cause stress, and developing a plan to manage stress. Keep your skin well moisturized. Keep baths or showers shorter than 5 minutes and use warm water. Do not use hot water. This information is not intended to replace advice given to you by your health care provider. Make sure you discuss any questions you have with your health care provider. Document Revised: 02/06/2020 Document Reviewed: 02/06/2020 Elsevier Patient Education  2023 [employer].

## 2022-10-22 NOTE — Progress Notes (Signed)
Subjective:    Patient ID: April Finley, female    DOB: 1976/02/12, 47 y.o.   MRN: [id number]  HPI Rash all over body abd, arms and legs - recently completed abx for tonsillitis and developed dermatitis was seen over video visits for each one For same for her rash on 10/04/2022 by video visit.  Seen a second time on 10/18/2022 for persistent rash.  At that time prescribed hydroxyzine and triamcinolone twice daily. Continues to have a significant rash especially on her arms and abdomen.  1 small area on her right lower leg.  Very pruritic.  Avoids scratching during the day because she is busy but unbearable at nighttime.  No known contacts.  No known triggers.  No changes in any hygiene or laundry products.  States the prednisone pills prescribed at previous visit showed some improvement in rash.  No known allergens to foods.  No difficulty with swallowing or breathing.  No wheezing.  Taking hydroxyzine about 3 times per day to help control itching.  No longer makes her drowsy at this point.  Some dry mouth but no urinary side effects.  Review of Systems  Constitutional:  Negative for fever.  HENT:  Negative for trouble swallowing.   Respiratory:  Negative for cough, chest tightness, shortness of breath and wheezing.   Cardiovascular:  Negative for chest pain.  Skin:  Positive for rash.       Objective:   Physical Exam NAD.  Alert, oriented.  Lungs clear.  Heart regular rate rhythm.  Multiple dry hyperpigmented patches noted particularly on the left forearm and flexor area.  Some lichenification noted.  Multiple tiny dry papules noted on both forearms.  A similar rash is noted along the mid abdominal area.  Patch of hyperpigmented dry rash noted underneath both breast, patient states this is a chronic problem. Today's Vitals   10/22/22 1337  BP: (!) 162/115  Pulse: (!) 111  Temp: (!) 97.5 F (36.4 C)  SpO2: 99%  Weight: 190 lb (86.2 kg)  Height: 5\' 4"  (1.626 m)   Body mass index is 32.61  kg/m.        Assessment & Plan:   Problem List Items Addressed This Visit       Musculoskeletal and Integument   Atopic dermatitis in adult - Primary   Meds ordered this encounter  Medications   predniSONE (DELTASONE) 20 MG tablet    Sig: 3 po qd x 3 d then 2 po qd x 3 d then 1 po qd x 2 d    Dispense:  17 tablet    Refill:  0    Order Specific Question:   Supervising Provider    Answer:   Lilyan Punt A [[id number]]   clobetasol cream (TEMOVATE) 0.05 %    Sig: Apply 1 Application topically 2 (two) times daily. Prn itching and rash up to 2 weeks at a time    Dispense:  30 g    Refill:  0    Order Specific Question:   Supervising Provider    Answer:   Lilyan Punt A [[id number]]   hydrOXYzine (ATARAX) 25 MG tablet    Sig: Take 1 tablet (25 mg total) by mouth every 8 (eight) hours as needed for anxiety or itching.    Dispense:  90 tablet    Refill:  0    Order Specific Question:   Supervising Provider    Answer:   Lilyan Punt A [[id number]]   Continue hydroxyzine as directed.  Add Pepcid OTC as directed. Oral prednisone taper. Clobetasol cream to rash as directed. Feel the atopic dermatitis and eczema probably started with the persistent rashes under her breast. Given written and verbal information on proper skin care. Call back in 2 to 3 weeks if no improvement, sooner if worse.  Consider referral to a specialist at that time. Recommend preventive health physical and recheck of her blood pressure.

## 2022-10-23 ENCOUNTER — Encounter: Payer: Self-pay | Admitting: Nurse Practitioner

## 2022-10-29 ENCOUNTER — Other Ambulatory Visit: Payer: Self-pay | Admitting: Family Medicine

## 2022-11-05 ENCOUNTER — Ambulatory Visit: Payer: BC Managed Care – PPO | Admitting: Family Medicine

## 2022-11-07 ENCOUNTER — Other Ambulatory Visit: Payer: Self-pay | Admitting: Nurse Practitioner

## 2023-03-09 ENCOUNTER — Other Ambulatory Visit: Payer: Self-pay | Admitting: Obstetrics & Gynecology

## 2023-03-12 ENCOUNTER — Telehealth: Payer: Self-pay

## 2023-03-12 NOTE — Telephone Encounter (Signed)
Patient called and stated that she needs a refill on her megestrol (MEGACE) 40 MG tablet .  Pt would like a call or a Mychart message when it has been sent.

## 2023-05-11 ENCOUNTER — Encounter (HOSPITAL_COMMUNITY): Payer: Self-pay | Admitting: Internal Medicine

## 2023-06-03 ENCOUNTER — Other Ambulatory Visit: Payer: Self-pay | Admitting: Family Medicine

## 2023-07-30 ENCOUNTER — Telehealth: Payer: Self-pay | Admitting: Family Medicine

## 2023-07-30 ENCOUNTER — Telehealth: Payer: Self-pay | Admitting: Physician Assistant

## 2023-07-30 ENCOUNTER — Encounter: Payer: Self-pay | Admitting: Family Medicine

## 2023-07-30 DIAGNOSIS — A084 Viral intestinal infection, unspecified: Secondary | ICD-10-CM

## 2023-07-30 MED ORDER — ONDANSETRON 4 MG PO TBDP
4.0000 mg | ORAL_TABLET | Freq: Three times a day (TID) | ORAL | 0 refills | Status: AC | PRN
Start: 2023-07-30 — End: ?

## 2023-07-30 NOTE — Progress Notes (Signed)
 E-Visit for Diarrhea  We are sorry that you are not feeling well.  Here is how we plan to help!  Based on what you have shared with me it looks like you have Acute Infectious Diarrhea.  Most cases of acute diarrhea are due to infections with virus and bacteria and are self-limited conditions lasting less than 14 days.  For your symptoms you may take Imodium 2 mg tablets that are over the counter at your local pharmacy. Take two tablet now and then one after each loose stool up to 6 a day.  Antibiotics are not needed for most people with diarrhea.  I have prescribed  Zofran 4 mg 1 tablet every 8 hours as needed for nausea and vomiting  HOME CARE We recommend changing your diet to help with your symptoms for the next few days. Drink plenty of fluids that contain water salt and sugar. Sports drinks such as Gatorade may help.  You may try broths, soups, bananas, applesauce, soft breads, mashed potatoes or crackers.  You are considered infectious for as long as the diarrhea continues. Hand washing or use of alcohol based hand sanitizers is recommend. It is best to stay out of work or school until your symptoms stop.   GET HELP RIGHT AWAY If you have dark yellow colored urine or do not pass urine frequently you should drink more fluids.   If your symptoms worsen  If you feel like you are going to pass out (faint) You have a new problem  MAKE SURE YOU  Understand these instructions. Will watch your condition. Will get help right away if you are not doing well or get worse.  Thank you for choosing an e-visit.  Your e-visit answers were reviewed by a board certified advanced clinical practitioner to complete your personal care plan. Depending upon the condition, your plan could have included both over the counter or prescription medications.  Please review your pharmacy choice. Make sure the pharmacy is open so you can pick up prescription now. If there is a problem, you may contact your  provider through [employer] and have the prescription routed to another pharmacy.  Your safety is important to [place]. If you have drug allergies check your prescription carefully.   For the next 24 hours you can use MyChart to ask questions about today's visit, request a non-urgent call back, or ask for a work or school excuse. You will get an email in the next two days asking about your experience. I hope that your e-visit has been valuable and will speed your recovery.

## 2023-07-30 NOTE — Progress Notes (Signed)
   Subjective:    Patient ID: April Finley, female    DOB: 1975-07-31, 48 y.o.   MRN: [id number]  HPI Patient with vomiting diarrhea started yesterday.  Multiple episodes of vomiting multiple episodes of diarrhea today 2 episodes of vomiting 3 episodes of diarrhea not bloody not black denies abdominal pain denies high fever chills sweats denies respiratory symptoms denies other people being sick did not was not able to go to work Virtual Visit via Video Note  I connected with April Finley on 07/30/23 at  3:10 PM EDT by a video enabled telemedicine application and verified that I am speaking with the correct person using two identifiers.  Location: Patient: Home Provider: Office   I discussed the limitations of evaluation and management by telemedicine and the availability of in person appointments. The patient expressed understanding and agreed to proceed.  History of Present Illness:    Observations/Objective:   Assessment and Plan:   Follow Up Instructions:    I discussed the assessment and treatment plan with the patient. The patient was provided an opportunity to ask questions and all were answered. The patient agreed with the plan and demonstrated an understanding of the instructions.   The patient was advised to call back or seek an in-person evaluation if the symptoms worsen or if the condition fails to improve as anticipated.  I provided 15 minutes of non-face-to-face time during this encounter.   Lilyan Punt, MD   Review of Systems     Objective:   Physical Exam  Patient had virtual visit-video Appears to be in no distress Atraumatic Neuro able to relate and oriented No apparent resp distress Color normal Patient does not appear toxic      Assessment & Plan:  Gastroenteritis Supportive measures discussed Already has Zofran May use Imodium If progressive symptoms are worse follow-up Recheck tomorrow in person if worse No work this week More than  likely viral gastroenteritis

## 2023-07-30 NOTE — Progress Notes (Signed)
 Message sent to patient requesting further input regarding current symptoms. Awaiting patient response.

## 2023-07-30 NOTE — Progress Notes (Signed)
 I have spent 5 minutes in review of e-visit questionnaire, review and updating patient chart, medical decision making and response to patient.   Piedad Climes, PA-C

## 2023-07-31 ENCOUNTER — Encounter (HOSPITAL_COMMUNITY): Payer: Self-pay | Admitting: Internal Medicine

## 2023-08-14 ENCOUNTER — Other Ambulatory Visit: Payer: Self-pay | Admitting: Family Medicine

## 2023-08-16 NOTE — Telephone Encounter (Signed)
 Nurses-this is a large amount of hydroxyzine-please asked the patient how much is she really taking this?  As in per day, per week  Frequent use of this medicine along with the potassium increases the risk of ulcers, it can be used some but it should not be used super frequently-please find out then we go from there

## 2023-08-18 ENCOUNTER — Telehealth: Payer: Self-pay

## 2023-08-18 NOTE — Telephone Encounter (Signed)
 Called pt to go over Dr Cathlyn Parsons concerns, no answer.

## 2023-08-18 NOTE — Telephone Encounter (Signed)
 Nurses-this is a large amount of hydroxyzine-please asked the patient how much is she really taking this?  As in per day, per week   Frequent use of this medicine along with the potassium increases the risk of ulcers, it can be used some but it should not be used super frequently-please find out then we go from there   Called pt to go over Dr Cathlyn Parsons concerns, no answer

## 2023-08-18 NOTE — Telephone Encounter (Signed)
 Please see previous question regarding the frequency of this medication and the concerns I have thank you

## 2023-09-29 ENCOUNTER — Other Ambulatory Visit: Payer: Self-pay | Admitting: Obstetrics & Gynecology

## 2023-12-22 ENCOUNTER — Other Ambulatory Visit: Payer: Self-pay | Admitting: Family Medicine

## 2023-12-22 NOTE — Telephone Encounter (Signed)
 May have 60 tablets 1 twice daily with 1 additional refill needs office visit with myself or Elveria Quarry or Charmaine Grooms thank you

## 2024-01-08 ENCOUNTER — Other Ambulatory Visit: Payer: Self-pay | Admitting: Family Medicine

## 2024-01-21 ENCOUNTER — Other Ambulatory Visit: Payer: Self-pay | Admitting: Family Medicine
